# Patient Record
Sex: Male | Born: 1970 | Race: Black or African American | Hispanic: No | Marital: Single | Smoking: Current every day smoker
Health system: Southern US, Community
[De-identification: ages and names within clinical notes are randomized; demographics above are authoritative.]

## PROBLEM LIST (undated history)

## (undated) DIAGNOSIS — C9200 Acute myeloblastic leukemia, not having achieved remission: Principal | ICD-10-CM

## (undated) DIAGNOSIS — I10 Essential (primary) hypertension: Secondary | ICD-10-CM

## (undated) DIAGNOSIS — B1920 Unspecified viral hepatitis C without hepatic coma: Secondary | ICD-10-CM

## (undated) HISTORY — DX: Acute myeloblastic leukemia, not having achieved remission: C92.00

## (undated) HISTORY — PX: PORTACATH PLACEMENT: SHX2246

## (undated) HISTORY — PX: BONE MARROW ASPIRATION: SHX1252

## (undated) HISTORY — DX: Essential (primary) hypertension: I10

---

## 2014-06-16 ENCOUNTER — Telehealth: Payer: Self-pay | Admitting: Hematology

## 2014-06-16 NOTE — Telephone Encounter (Signed)
CALLED PATIENT TO GIVE NP APPT NO ANSWER NOT ABLE TO LEAVE MESSAGE.

## 2014-06-27 ENCOUNTER — Ambulatory Visit: Payer: Self-pay

## 2014-06-27 ENCOUNTER — Ambulatory Visit: Payer: Self-pay | Admitting: Oncology

## 2014-06-27 ENCOUNTER — Other Ambulatory Visit: Payer: Self-pay

## 2014-07-26 ENCOUNTER — Telehealth: Payer: Self-pay | Admitting: Hematology and Oncology

## 2014-07-26 ENCOUNTER — Encounter: Payer: Self-pay | Admitting: Hematology and Oncology

## 2014-07-26 ENCOUNTER — Other Ambulatory Visit: Payer: Self-pay | Admitting: Hematology and Oncology

## 2014-07-26 ENCOUNTER — Other Ambulatory Visit: Payer: Self-pay

## 2014-07-26 DIAGNOSIS — C9200 Acute myeloblastic leukemia, not having achieved remission: Secondary | ICD-10-CM

## 2014-07-26 HISTORY — DX: Acute myeloblastic leukemia, not having achieved remission: C92.00

## 2014-07-26 NOTE — Telephone Encounter (Signed)
cld & spoke w/pt in re to appt-gave pt time & date-sent [facility] staff message to sch pt blood trans-adv pt she will be calling

## 2014-07-26 NOTE — Telephone Encounter (Signed)
per pof to sch pt appt-Urgent pof to sch blood trans-sent [facility] staff message to advise trmt room has no openings-adv she would have to call [facility] to sch

## 2014-07-27 ENCOUNTER — Telehealth: Payer: Self-pay | Admitting: Hematology and Oncology

## 2014-07-27 ENCOUNTER — Ambulatory Visit: Payer: Self-pay

## 2014-07-27 ENCOUNTER — Telehealth: Payer: Self-pay | Admitting: *Deleted

## 2014-07-27 ENCOUNTER — Encounter: Payer: Self-pay | Admitting: Hematology and Oncology

## 2014-07-27 ENCOUNTER — Ambulatory Visit (HOSPITAL_BASED_OUTPATIENT_CLINIC_OR_DEPARTMENT_OTHER): Payer: Self-pay | Admitting: Hematology and Oncology

## 2014-07-27 ENCOUNTER — Other Ambulatory Visit: Payer: Self-pay | Admitting: Hematology and Oncology

## 2014-07-27 ENCOUNTER — Ambulatory Visit (HOSPITAL_BASED_OUTPATIENT_CLINIC_OR_DEPARTMENT_OTHER): Payer: Self-pay

## 2014-07-27 ENCOUNTER — Other Ambulatory Visit (HOSPITAL_BASED_OUTPATIENT_CLINIC_OR_DEPARTMENT_OTHER): Payer: Self-pay

## 2014-07-27 ENCOUNTER — Ambulatory Visit (HOSPITAL_COMMUNITY)
Admission: RE | Admit: 2014-07-27 | Discharge: 2014-07-27 | Disposition: A | Discharge status: Home / Self Care | Payer: Medicaid Other | Source: Ambulatory Visit | Attending: Hematology and Oncology | Admitting: Hematology and Oncology

## 2014-07-27 VITALS — BP 127/68 | HR 73 | Temp 98.2°F | Resp 18

## 2014-07-27 VITALS — BP 130/74 | HR 64 | Temp 97.8°F | Resp 19 | Ht 71.0 in | Wt 174.1 lb

## 2014-07-27 DIAGNOSIS — F17200 Nicotine dependence, unspecified, uncomplicated: Secondary | ICD-10-CM

## 2014-07-27 DIAGNOSIS — D630 Anemia in neoplastic disease: Secondary | ICD-10-CM

## 2014-07-27 DIAGNOSIS — J17 Pneumonia in diseases classified elsewhere: Secondary | ICD-10-CM

## 2014-07-27 DIAGNOSIS — C9201 Acute myeloblastic leukemia, in remission: Secondary | ICD-10-CM

## 2014-07-27 DIAGNOSIS — C9200 Acute myeloblastic leukemia, not having achieved remission: Secondary | ICD-10-CM

## 2014-07-27 DIAGNOSIS — D6959 Other secondary thrombocytopenia: Secondary | ICD-10-CM

## 2014-07-27 DIAGNOSIS — T50905A Adverse effect of unspecified drugs, medicaments and biological substances, initial encounter: Secondary | ICD-10-CM

## 2014-07-27 DIAGNOSIS — R748 Abnormal levels of other serum enzymes: Secondary | ICD-10-CM

## 2014-07-27 DIAGNOSIS — T451X5A Adverse effect of antineoplastic and immunosuppressive drugs, initial encounter: Secondary | ICD-10-CM

## 2014-07-27 DIAGNOSIS — D72819 Decreased white blood cell count, unspecified: Secondary | ICD-10-CM

## 2014-07-27 DIAGNOSIS — D701 Agranulocytosis secondary to cancer chemotherapy: Secondary | ICD-10-CM

## 2014-07-27 DIAGNOSIS — J168 Pneumonia due to other specified infectious organisms: Secondary | ICD-10-CM | POA: Insufficient documentation

## 2014-07-27 DIAGNOSIS — B49 Unspecified mycosis: Secondary | ICD-10-CM

## 2014-07-27 LAB — COMPREHENSIVE METABOLIC PANEL (CC13)
ALK PHOS: 174 U/L — AB (ref 40–150)
ALT: 62 U/L — AB (ref 0–55)
AST: 65 U/L — AB (ref 5–34)
Albumin: 4 g/dL (ref 3.5–5.0)
Anion Gap: 9 mEq/L (ref 3–11)
BILIRUBIN TOTAL: 0.83 mg/dL (ref 0.20–1.20)
BUN: 18.1 mg/dL (ref 7.0–26.0)
CO2: 30 mEq/L — ABNORMAL HIGH (ref 22–29)
CREATININE: 1.2 mg/dL (ref 0.7–1.3)
Calcium: 9.5 mg/dL (ref 8.4–10.4)
Chloride: 100 mEq/L (ref 98–109)
EGFR: 85 mL/min/{1.73_m2} — ABNORMAL LOW (ref >90)
Glucose: 119 mg/dl (ref 70–140)
Potassium: 3.6 mEq/L (ref 3.5–5.1)
Sodium: 139 mEq/L (ref 136–145)
Total Protein: 7.6 g/dL (ref 6.4–8.3)

## 2014-07-27 LAB — CBC WITH DIFFERENTIAL/PLATELET
BASO%: 0 % (ref 0.0–2.0)
Basophils Absolute: 0 10*3/uL (ref 0.0–0.1)
EOS%: 0 % (ref 0.0–7.0)
Eosinophils Absolute: 0 10*3/uL (ref 0.0–0.5)
HEMATOCRIT: 28 % — AB (ref 38.4–49.9)
HGB: 10.1 g/dL — ABNORMAL LOW (ref 13.0–17.1)
LYMPH%: 49.1 % — AB (ref 14.0–49.0)
MCH: 31 pg (ref 27.2–33.4)
MCHC: 36.1 g/dL — AB (ref 32.0–36.0)
MCV: 85.9 fL (ref 79.3–98.0)
MONO#: 0 10*3/uL — ABNORMAL LOW (ref 0.1–0.9)
MONO%: 0 % (ref 0.0–14.0)
NEUT#: 0.6 10*3/uL — ABNORMAL LOW (ref 1.5–6.5)
NEUT%: 50.9 % (ref 39.0–75.0)
PLATELETS: 11 10*3/uL — AB (ref 140–400)
RBC: 3.26 10*6/uL — ABNORMAL LOW (ref 4.20–5.82)
RDW: 12.3 % (ref 11.0–14.6)
WBC: 1.1 10*3/uL — ABNORMAL LOW (ref 4.0–10.3)
lymph#: 0.6 10*3/uL — ABNORMAL LOW (ref 0.9–3.3)

## 2014-07-27 LAB — HOLD TUBE, BLOOD BANK

## 2014-07-27 LAB — TECHNOLOGIST REVIEW

## 2014-07-27 LAB — ABO/RH: ABO/RH(D): A NEG

## 2014-07-27 MED ORDER — HEPARIN SOD (PORK) LOCK FLUSH 100 UNIT/ML IV SOLN
500.0000 [IU] | Freq: Every day | INTRAVENOUS | Status: DC | PRN
  Filled 2014-07-27: qty 5

## 2014-07-27 MED ORDER — SODIUM CHLORIDE 0.9 % IJ SOLN
10.0000 mL | INTRAMUSCULAR | Status: DC | PRN
  Filled 2014-07-27: qty 10

## 2014-07-27 MED ORDER — LIDOCAINE-PRILOCAINE 2.5-2.5 % EX CREA: 1.0000 "application " | TOPICAL_CREAM | CUTANEOUS | Status: DC | PRN

## 2014-07-27 MED ORDER — SODIUM CHLORIDE 0.9 % IV SOLN
250.0000 mL | Freq: Once | INTRAVENOUS | Status: AC
  Administered 2014-07-27: 250 mL via INTRAVENOUS

## 2014-07-27 NOTE — Assessment & Plan Note (Signed)
This is likely due to recent treatment. The patient denies recent history of bleeding such as epistaxis, hematuria or hematochezia. He is asymptomatic from the low platelet count. With the blood counts running so low, I recommend transfusions of platelets today. The patient will need irradiated blood products. I recommend transfusion threshold of 15,000 for him

## 2014-07-27 NOTE — Patient Instructions (Signed)
Platelet Transfusion Information °This is information about transfusions of platelets. Platelets are tiny cells made by the bone marrow and found in the blood. When a blood vessel is damaged, platelets rush to the damaged area to help form a clot. This begins the healing process. When platelets get very low, your blood may have trouble clotting. This may be from: °· Illness. °· Blood disorder. °· Chemotherapy to treat cancer. °Often, lower platelet counts do not cause problems.  °Platelets usually last for 7 to 10 days. If they are not used in an injury, they are broken down by the liver or spleen. °Symptoms of low platelet count include: °· Nosebleeds. °· Bleeding gums. °· Heavy periods. °· Bruising and tiny blood spots in the skin. °¨ Pinpoint spots of bleeding (petechiae). °¨ Larger bruises (purpura). °· Bleeding can be more serious if it happens in the brain or bowel. °Platelet transfusions are often used to keep the platelet count at an acceptable level. Serious bleeding due to low platelets is uncommon. °RISKS AND COMPLICATIONS °Severe side effects from platelet transfusions are uncommon. Minor reactions may include: °· Itching. °· Rashes. °· High temperature and shivering. °Medications are available to stop transfusion reactions. Let your health care provider know if you develop any of the above problems.  °If you are having platelet transfusions frequently, they may get less effective. This is called becoming refractory to platelets. It is uncommon. This can happen from non-immune causes and immune causes. Non-immune causes include: °· High temperatures. °· Some medications. °· An enlarged spleen. °Immune causes happen when your body discovers the platelets are not your own and begins making antibodies against them. The antibodies kill the platelets quickly. Even with platelet transfusions, you may still notice problems with bleeding or bruising. Let your health care providers know about this. Other things  can be done to help if this happens.  °BEFORE THE PROCEDURE  °· Your health care provider will check your platelet count regularly. °· If the platelet count is too low, it may be necessary to have a platelet transfusion. °· This is more important before certain procedures with a risk of bleeding, such as a spinal tap. °· Platelet transfusion reduces the risk of bleeding during or after the procedure. °· Except in emergencies, giving a transfusion requires a written consent. °Before blood is taken from a donor, a complete history is taken to make sure the person has no history of previous diseases, nor engages in risky social behavior. Examples of this are intravenous drug use or sexual activity with multiple partners. This could lead to infected blood or blood products being used. This history is taken in spite of the extensive testing to make sure the blood is safe. All blood products transfused are tested to make sure it is a match for the person getting the blood. It is also checked for infections. Blood is the safest it has ever been. The risk of getting an infection is very low. °PROCEDURE °· The platelets are stored in small plastic bags that are kept at a low temperature. °· Each bag is called a unit and sometimes two units are given. They are given through an intravenous line by drip infusion over about one-half hour. °· Usually blood is collected from multiple people to get enough to transfuse. °· Sometimes, the platelets are collected from a single person. This is done using a special machine that separates the platelets from the blood. The machine is called an apheresis machine. Platelets collected in this   way are called apheresed platelets. Apheresed platelets reduce the risk of becoming sensitive to the platelets. This lowers the chances of having a transfusion reaction. °· As it only takes a short time to give the platelets, this treatment can be given in an outpatient department. Platelets can also be  given before or after other treatments. °SEEK IMMEDIATE MEDICAL CARE IF: °You have any of the following symptoms over the next 12 hours or several days: °· Shaking chills. °· Fever with a temperature greater than 102°F (38.9°C) develops. °· Back pain or muscle pain. °· People around you feel you are not acting correctly, or you are confused. °· Blood in the urine or bowel movements, or bleeding from any place in your body. °· Shortness of breath, or difficulty breathing. °· Dizziness. °· Fainting. °· You break out in a rash or develop hives. °· Decrease in the amount of urine you are putting out, or the urine turns a dark color or changes to pink, red, or brown. °· A severe headache or stiff neck. °· Bruising more easily. °Document Released: 06/08/2007 Document Revised: 12/26/2013 Document Reviewed: 06/08/2007 °Exit[employer]® Patient Information ©2015 [employer]. This information is not intended to replace advice given to you by your health care provider. Make sure you discuss any questions you have with your health care provider. ° °

## 2014-07-27 NOTE — Progress Notes (Signed)
[facility] CONSULT NOTE  Patient Care Team: Art Buff, MD as PCP - General (Hematology and Oncology)  CHIEF COMPLAINTS/PURPOSE OF CONSULTATION:  Acute myelogenous leukemia, for supportive care  HISTORY OF PRESENTING ILLNESS:  Derrick Callahan 43 y.o. male is here because of recent diagnosis of acute myelogenous subcutaneous. I will review his outside records extensively and summarized as follows: He presented to the emergency department with gingival bleeding and fatigue. When he presented to the emergency department, he has significant elevated white blood cell count of greater than 10 100,000 with significant blasts counts, severe anemia and low platelet count. 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 The patient has achieved good remission status. He received consolidation chemotherapy with high-dose cytarabine on November 19. He is referred here for transfusion support due to difficulties in traveling to [facility] [place] for supportive care. He is doing well. Denies recent infection. The patient denies any recent signs or symptoms of bleeding such as spontaneous epistaxis, hematuria or hematochezia.   MEDICAL HISTORY:  Past Medical History  Diagnosis Date  . AML (acute myelogenous leukemia) 07/26/2014  . Hypertension     SURGICAL HISTORY: Past Surgical History  Procedure Laterality Date  . Portacath placement      SOCIAL HISTORY: History   Social History  . Marital Status: Single    Spouse Name: N/A    Number of Children: N/A  . Years of Education: N/A   Occupational History  . Not on file.   Social History Main Topics  . Smoking  status: Current Every Day Smoker -- 0.20 packs/day for 24 years  . Smokeless tobacco: Never Used  . Alcohol Use: 0.6 oz/week    1 Shots of liquor per week  . Drug Use: No  . Sexual Activity: Not on file   Other Topics Concern  . Not on file   Social History Narrative    FAMILY HISTORY: Family History  Problem Relation Age of Onset  . Cancer Maternal Grandmother     unknown ca    ALLERGIES:  has No Known Allergies.  MEDICATIONS:  Current Outpatient Prescriptions  Medication Sig Dispense Refill  . acyclovir (ZOVIRAX) 400 MG tablet Take 400 mg by mouth 2 (two) times daily.    . hydrochlorothiazide (HYDRODIURIL) 25 MG tablet Take 25 mg by mouth daily.  5  . levofloxacin (LEVAQUIN) 500 MG tablet Take 500 mg by mouth daily.    . prochlorperazine (COMPAZINE) 10 MG tablet Take 10 mg by mouth every 6 (six) hours as needed for nausea or vomiting.    . voriconazole (VFEND) 200 MG tablet Take 200 mg by mouth 2 (two) times daily.    Marland Kitchen lidocaine-prilocaine (EMLA) cream Apply 1 application topically as needed. Apply to porta cath site one hour prior to needle stick. 30 g 6   No current facility-administered medications for this visit.    REVIEW OF SYSTEMS:   Constitutional: Denies fevers, chills or abnormal night sweats Eyes: Denies blurriness of vision, double vision or watery eyes Ears, nose, mouth, throat, and face: Denies mucositis or sore throat Respiratory: Denies cough, dyspnea or wheezes Cardiovascular: Denies palpitation, chest discomfort or lower extremity swelling Gastrointestinal:  Denies nausea, heartburn or change in bowel habits Skin: Denies abnormal skin  rashes Lymphatics: Denies new lymphadenopathy or easy bruising Neurological:Denies numbness, tingling or new weaknesses Behavioral/Psych: Mood is stable, no new changes  All other systems were reviewed with the patient and are negative.  PHYSICAL EXAMINATION: ECOG PERFORMANCE STATUS: 0 - Asymptomatic  Filed Vitals:    07/27/14 1130  BP: 130/74  Pulse: 64  Temp: 97.8 F (36.6 C)  Resp: 19   Filed Weights   07/27/14 1130  Weight: 174 lb 1.6 oz (78.971 kg)    GENERAL:alert, no distress and comfortable SKIN: skin color, texture, turgor are normal, no rashes or significant lesions EYES: normal, conjunctiva are pale and non-injected, sclera clear OROPHARYNX:no exudate, no erythema and lips, buccal mucosa, and tongue normal  NECK: supple, thyroid normal size, non-tender, without nodularity LYMPH:  no palpable lymphadenopathy in the cervical, axillary or inguinal LUNGS: clear to auscultation and percussion with normal breathing effort HEART: regular rate & rhythm and no murmurs and no lower extremity edema ABDOMEN:abdomen soft, non-tender and normal bowel sounds Musculoskeletal:no cyanosis of digits and no clubbing  PSYCH: alert & oriented x 3 with fluent speech NEURO: no focal motor/sensory deficits  LABORATORY DATA:  I have reviewed the data as listed Lab Results  Component Value Date   WBC 1.1* 07/27/2014   HGB 10.1* 07/27/2014   HCT 28.0* 07/27/2014   MCV 85.9 07/27/2014   PLT 11* 07/27/2014    Recent Labs  07/27/14 1101  NA 139  K 3.6  CO2 30*  GLUCOSE 119  BUN 18.1  CREATININE 1.2  CALCIUM 9.5  PROT 7.6  ALBUMIN 4.0  AST 65*  ALT 62*  ALKPHOS 174*  BILITOT 0.83  ASSESSMENT & PLAN:  AML (acute myelogenous leukemia) Overall, he tolerated treatment well with expected side effects. I will continue supportive care visits here. The patient will come in on Mondays and Thursdays every week for transfusion support. I will see him in January after his second consolidation chemotherapy.  Thrombocytopenia due to drugs This is likely due to recent treatment. The patient denies recent history of bleeding such as epistaxis, hematuria or hematochezia. He is asymptomatic from the low platelet count. With the blood counts running so low, I recommend transfusions of platelets today. The  patient will need irradiated blood products. I recommend transfusion threshold of 15,000 for him   Leukopenia due to antineoplastic chemotherapy This is likely due to recent treatment. The patient denies recent history of fevers, cough, chills, diarrhea or dysuria. He is asymptomatic from the leukopenia. I will observe for now.  He will continue antimicrobial therapy as directed by his oncologist at [place].  Fungal pneumonia He is currently receiving active treatment for this.  Anemia in neoplastic disease This is likely due to recent treatment. The patient denies recent history of bleeding such as epistaxis, hematuria or hematochezia. He is asymptomatic from the anemia. I will observe for now.   We'll establish transfusion threshold to give him 1 unit of blood whenever his hemoglobin is less than 8 g. He needs irradiated blood products.  Elevated liver enzymes This is likely related to recent treatment and his antimicrobial agents. I will monitor this closely. I recommend the patient to stop drinking alcohol.  Tobacco dependency I spent some time counseling the patient the importance of tobacco cessation. he is currently attempting to quit on his own      All questions were answered. The patient knows to call the clinic with any problems, questions or concerns. I spent 55 minutes counseling the patient face  to face. The total time spent in the appointment was 60 minutes and more than 50% was on counseling.     [facility], Blaise Grieshaber, MD 07/27/2014 9:34 PM

## 2014-07-27 NOTE — Telephone Encounter (Signed)
-----   Message from [place] sent at 07/27/2014  8:29 AM EST ----- Patient is calling want to know what time blood trans would be & where. Please contact pt as patient coming in for an appointment here today.  ----- Message -----    From: Melina Modena McCoy-Collins    Sent: 07/26/2014   2:33 PM      To: Cathlean Cower, RN, #  Per trmtroom no availability. You will need to contact [facility] to see if the patient can go there. Appt is sch per Urgent pof.  Thanks,  [facility]

## 2014-07-27 NOTE — Assessment & Plan Note (Signed)
I spent some time counseling the patient the importance of tobacco cessation. he is currently attempting to quit on his own 

## 2014-07-27 NOTE — Assessment & Plan Note (Signed)
This is likely due to recent treatment. The patient denies recent history of bleeding such as epistaxis, hematuria or hematochezia. He is asymptomatic from the anemia. I will observe for now.   We'll establish transfusion threshold to give him 1 unit of blood whenever his hemoglobin is less than 8 g. He needs irradiated blood products. 

## 2014-07-27 NOTE — Telephone Encounter (Signed)
Per staff message and POF I have scheduled appts. Advised scheduler of appts. JMW  

## 2014-07-27 NOTE — Assessment & Plan Note (Signed)
Overall, he tolerated treatment well with expected side effects. I will continue supportive care visits here. The patient will come in on Mondays and Thursdays every week for transfusion support. I will see him in January after his second consolidation chemotherapy.

## 2014-07-27 NOTE — Assessment & Plan Note (Signed)
This is likely due to recent treatment. The patient denies recent history of fevers, cough, chills, diarrhea or dysuria. He is asymptomatic from the leukopenia. I will observe for now.  He will continue antimicrobial therapy as directed by his oncologist at [place].

## 2014-07-27 NOTE — Assessment & Plan Note (Signed)
He is currently receiving active treatment for this.

## 2014-07-27 NOTE — Telephone Encounter (Signed)
Confirmed pt's appts today.  He verbalized understanding.

## 2014-07-27 NOTE — Telephone Encounter (Signed)
gv and printed appt sched and avs for Dec....sed added tx

## 2014-07-27 NOTE — Assessment & Plan Note (Signed)
This is likely related to recent treatment and his antimicrobial agents. I will monitor this closely. I recommend the patient to stop drinking alcohol.

## 2014-07-27 NOTE — Progress Notes (Signed)
Per Dr.Gorsuch, use POC for platelet transfusion. POC is double, inner port accessed.

## 2014-07-27 NOTE — Progress Notes (Signed)
Checked in new pt with no insurance.  Pt has applied for Medicaid is awaiting approval.  I advised pt to let us know when he gets approved so billing can resubmit bills to [facility] for reimbursement.

## 2014-07-27 NOTE — Telephone Encounter (Signed)
pt cld wanting toknow about his appt and blood trnas and directions-gave appt & directions-adv Dr Alvy Bimler nurse s/b calling him with that time & location-resent srtaff message to [facility] & cc Dr Alvy Bimler & Melissa-message sent urgent 07/27/14-Adv pt I would have nurse callback-tried calling n/a. Cld pt back he stated he was on the phone talking to nurse

## 2014-07-28 LAB — PREPARE PLATELET PHERESIS: UNIT DIVISION: 0

## 2014-07-31 ENCOUNTER — Ambulatory Visit (HOSPITAL_BASED_OUTPATIENT_CLINIC_OR_DEPARTMENT_OTHER): Payer: Self-pay

## 2014-07-31 ENCOUNTER — Other Ambulatory Visit: Payer: Self-pay | Admitting: *Deleted

## 2014-07-31 ENCOUNTER — Telehealth: Payer: Self-pay | Admitting: *Deleted

## 2014-07-31 ENCOUNTER — Other Ambulatory Visit (HOSPITAL_BASED_OUTPATIENT_CLINIC_OR_DEPARTMENT_OTHER): Payer: Self-pay

## 2014-07-31 VITALS — BP 149/83 | HR 58 | Temp 98.6°F | Resp 18

## 2014-07-31 DIAGNOSIS — C9200 Acute myeloblastic leukemia, not having achieved remission: Secondary | ICD-10-CM

## 2014-07-31 LAB — COMPREHENSIVE METABOLIC PANEL (CC13)
ALT: 54 U/L (ref 0–55)
ANION GAP: 9 meq/L (ref 3–11)
AST: 57 U/L — ABNORMAL HIGH (ref 5–34)
Albumin: 3.6 g/dL (ref 3.5–5.0)
Alkaline Phosphatase: 157 U/L — ABNORMAL HIGH (ref 40–150)
BUN: 18.2 mg/dL (ref 7.0–26.0)
CO2: 28 meq/L (ref 22–29)
CREATININE: 1.1 mg/dL (ref 0.7–1.3)
Calcium: 9.1 mg/dL (ref 8.4–10.4)
Chloride: 105 mEq/L (ref 98–109)
EGFR: >90 mL/min/{1.73_m2} (ref >90)
Glucose: 93 mg/dl (ref 70–140)
Potassium: 4.2 mEq/L (ref 3.5–5.1)
Sodium: 142 mEq/L (ref 136–145)
Total Bilirubin: 0.67 mg/dL (ref 0.20–1.20)
Total Protein: 6.9 g/dL (ref 6.4–8.3)

## 2014-07-31 LAB — HOLD TUBE, BLOOD BANK

## 2014-07-31 LAB — CBC WITH DIFFERENTIAL/PLATELET
BASO%: 2.1 % — ABNORMAL HIGH (ref 0.0–2.0)
Basophils Absolute: 0 10*3/uL (ref 0.0–0.1)
EOS ABS: 0 10*3/uL (ref 0.0–0.5)
EOS%: 0 % (ref 0.0–7.0)
HCT: 21.9 % — ABNORMAL LOW (ref 38.4–49.9)
HGB: 7.9 g/dL — ABNORMAL LOW (ref 13.0–17.1)
LYMPH%: 87.5 % — ABNORMAL HIGH (ref 14.0–49.0)
MCH: 30.3 pg (ref 27.2–33.4)
MCHC: 36.1 g/dL — ABNORMAL HIGH (ref 32.0–36.0)
MCV: 83.9 fL (ref 79.3–98.0)
MONO#: 0 10*3/uL — ABNORMAL LOW (ref 0.1–0.9)
MONO%: 4.2 % (ref 0.0–14.0)
NEUT%: 6.2 % — ABNORMAL LOW (ref 39.0–75.0)
NEUTROS ABS: 0 10*3/uL — AB (ref 1.5–6.5)
NRBC: 0 % (ref 0–0)
PLATELETS: 9 10*3/uL — AB (ref 140–400)
RBC: 2.61 10*6/uL — AB (ref 4.20–5.82)
RDW: 12.1 % (ref 11.0–14.6)
WBC: 0.5 10*3/uL — CL (ref 4.0–10.3)
lymph#: 0.4 10*3/uL — ABNORMAL LOW (ref 0.9–3.3)

## 2014-07-31 LAB — PREPARE RBC (CROSSMATCH)

## 2014-07-31 MED ORDER — HEPARIN SOD (PORK) LOCK FLUSH 100 UNIT/ML IV SOLN
250.0000 [IU] | INTRAVENOUS | Status: DC | PRN
  Filled 2014-07-31: qty 5

## 2014-07-31 MED ORDER — SODIUM CHLORIDE 0.9 % IV SOLN
Freq: Once | INTRAVENOUS | Status: AC
  Administered 2014-07-31: 11:00:00 via INTRAVENOUS

## 2014-07-31 MED ORDER — SODIUM CHLORIDE 0.9 % IJ SOLN
10.0000 mL | INTRAMUSCULAR | Status: AC | PRN
  Administered 2014-07-31: 10 mL
  Filled 2014-07-31: qty 10

## 2014-07-31 MED ORDER — HEPARIN SOD (PORK) LOCK FLUSH 100 UNIT/ML IV SOLN
500.0000 [IU] | Freq: Every day | INTRAVENOUS | Status: AC | PRN
  Administered 2014-07-31: 500 [IU]
  Filled 2014-07-31: qty 5

## 2014-07-31 NOTE — Patient Instructions (Signed)
Blood Transfusion Information WHAT IS A BLOOD TRANSFUSION? A transfusion is the replacement of blood or some of its parts. Blood is made up of multiple cells which provide different functions.  Red blood cells carry oxygen and are used for blood loss replacement.  White blood cells fight against infection.  Platelets control bleeding.  Plasma helps clot blood.  Other blood products are available for specialized needs, such as hemophilia or other clotting disorders. BEFORE THE TRANSFUSION  Who gives blood for transfusions?   You may be able to donate blood to be used at a later date on yourself (autologous donation).  Relatives can be asked to donate blood. This is generally not any safer than if you have received blood from a stranger. The same precautions are taken to ensure safety when a relative's blood is donated.  Healthy volunteers who are fully evaluated to make sure their blood is safe. This is blood bank blood. Transfusion therapy is the safest it has ever been in the practice of medicine. Before blood is taken from a donor, a complete history is taken to make sure that person has no history of diseases nor engages in risky social behavior (examples are intravenous drug use or sexual activity with multiple partners). The donor's travel history is screened to minimize risk of transmitting infections, such as malaria. The donated blood is tested for signs of infectious diseases, such as HIV and hepatitis. The blood is then tested to be sure it is compatible with you in order to minimize the chance of a transfusion reaction. If you or a relative donates blood, this is often done in anticipation of surgery and is not appropriate for emergency situations. It takes many days to process the donated blood. RISKS AND COMPLICATIONS Although transfusion therapy is very safe and saves many lives, the main dangers of transfusion include:   Getting an infectious disease.  Developing a  transfusion reaction. This is an allergic reaction to something in the blood you were given. Every precaution is taken to prevent this. The decision to have a blood transfusion has been considered carefully by your caregiver before blood is given. Blood is not given unless the benefits outweigh the risks. AFTER THE TRANSFUSION  Right after receiving a blood transfusion, you will usually feel much better and more energetic. This is especially true if your red blood cells have gotten low (anemic). The transfusion raises the level of the red blood cells which carry oxygen, and this usually causes an energy increase.  The nurse administering the transfusion will monitor you carefully for complications. HOME CARE INSTRUCTIONS  No special instructions are needed after a transfusion. You may find your energy is better. Speak with your caregiver about any limitations on activity for underlying diseases you may have. SEEK MEDICAL CARE IF:   Your condition is not improving after your transfusion.  You develop redness or irritation at the intravenous (IV) site. SEEK IMMEDIATE MEDICAL CARE IF:  Any of the following symptoms occur over the next 12 hours:  Shaking chills.  You have a temperature by mouth above 102 F (38.9 C), not controlled by medicine.  Chest, back, or muscle pain.  People around you feel you are not acting correctly or are confused.  Shortness of breath or difficulty breathing.  Dizziness and fainting.  You get a rash or develop hives.  You have a decrease in urine output.  Your urine turns a dark color or changes to pink, red, or brown. Any of the following   symptoms occur over the next 10 days:  You have a temperature by mouth above 102 F (38.9 C), not controlled by medicine.  Shortness of breath.  Weakness after normal activity.  The white part of the eye turns yellow (jaundice).  You have a decrease in the amount of urine or are urinating less often.  Your  urine turns a dark color or changes to pink, red, or brown. Document Released: 08/08/2000 Document Revised: 11/03/2011 Document Reviewed: 03/27/2008 [facility] Patient Information 2015 [place], [place]. This information is not intended to replace advice given to you by your health care provider. Make sure you discuss any questions you have with your health care provider.   Neutropenia Neutropenia is a condition that occurs when the level of a certain type of white blood cell (neutrophil) in your body becomes lower than normal. Neutrophils are made in the bone marrow and fight infections. These cells protect against bacteria and viruses. The fewer neutrophils you have, and the longer your body remains without them, the greater your risk of getting a severe infection becomes. CAUSES  The cause of neutropenia may be hard to determine. However, it is usually due to 3 main problems:   Decreased production of neutrophils. This may be due to:  Certain medicines such as chemotherapy.  Genetic problems.  Cancer.  Radiation treatments.  Vitamin deficiency.  Some pesticides.  Increased destruction of neutrophils. This may be due to:  Overwhelming infections.  Hemolytic anemia. This is when the body destroys its own blood cells.  Chemotherapy.  Neutrophils moving to areas of the body where they cannot fight infections. This may be due to:  Dialysis procedures.  Conditions where the spleen becomes enlarged. Neutrophils are held in the spleen and are not available to the rest of the body.  Overwhelming infections. The neutrophils are held in the area of the infection and are not available to the rest of the body. SYMPTOMS  There are no specific symptoms of neutropenia. The lack of neutrophils can result in an infection, and an infection can cause various problems. DIAGNOSIS  Diagnosis is made by a blood test. A complete blood count is performed. The normal level of neutrophils in human blood  differs with age and race. Infants have lower counts than older children and adults. African Americans have lower counts than Caucasians or Asians. The average adult level is 1500 cells/mm3 of blood. Neutrophil counts are interpreted as follows:  Greater than 1000 cells/mm3 gives normal protection against infection.  500 to 1000 cells/mm3 gives an increased risk for infection.  200 to 500 cells/mm3 is a greater risk for severe infection.  Lower than 200 cells/mm3 is a marked risk of infection. This may require hospitalization and treatment with antibiotic medicines. TREATMENT  Treatment depends on the underlying cause, severity, and presence of infections or symptoms. It also depends on your health. Your caregiver will discuss the treatment plan with you. Mild cases are often easily treated and have a good outcome. Preventative measures may also be started to limit your risk of infections. Treatment can include:  Taking antibiotics.  Stopping medicines that are known to cause neutropenia.  Correcting nutritional deficiencies by eating green vegetables to supply folic acid and taking vitamin B supplements.  Stopping exposure to pesticides if your neutropenia is related to pesticide exposure.  Taking a blood growth factor called sargramostim, pegfilgrastim, or filgrastim if you are undergoing chemotherapy for cancer. This stimulates white blood cell production.  Removal of the spleen if you have  Felty's syndrome and have repeated infections. HOME CARE INSTRUCTIONS   Follow your caregiver's instructions about when you need to have blood work done.  Wash your hands often. Make sure others who come in contact with you also wash their hands.  Wash raw fruits and vegetables before eating them. They can carry bacteria and fungi.  Avoid people with colds or spreadable (contagious) diseases (chickenpox, herpes zoster, influenza).  Avoid large crowds.  Avoid construction areas. The dust can  release fungus into the air.  Be cautious around children in daycare or school environments.  Take care of your respiratory system by coughing and deep breathing.  Bathe daily.  Protect your skin from cuts and burns.  Do not work in the garden or with flowers and plants.  Care for the mouth before and after meals by brushing with a soft toothbrush. If you have mucositis, do not use mouthwash. Mouthwash contains alcohol and can dry out the mouth even more.  Clean the area between the genitals and the anus (perineal area) after urination and bowel movements. Women need to wipe from front to back.  Use a water soluble lubricant during sexual intercourse and practice good hygiene after. Do not have intercourse if you are severely neutropenic. Check with your caregiver for guidelines.  Exercise daily as tolerated.  Avoid people who were vaccinated with a live vaccine in the past 30 days. You should not receive live vaccines (polio, typhoid).  Do not provide direct care for pets. Avoid animal droppings. Do not clean litter boxes and bird cages.  Do not share food utensils.  Do not use tampons, enemas, or rectal suppositories unless directed by your caregiver.  Use an electric razor to remove hair.  Wash your hands after handling magazines, letters, and newspapers. SEEK IMMEDIATE MEDICAL CARE IF:   You have a fever.  You have chills or start to shake.  You feel nauseous or vomit.  You develop mouth sores.  You develop aches and pains.  You have redness and swelling around open wounds.  Your skin is warm to the touch.  You have pus coming from your wounds.  You develop swollen lymph nodes.  You feel weak or fatigued.  You develop red streaks on the skin. MAKE SURE YOU:  Understand these instructions.  Will watch your condition.  Will get help right away if you are not doing well or get worse. Document Released: 01/31/2002 Document Revised: 11/03/2011 Document  Reviewed: 02/28/2011 [facility] Patient Information 2015 [place], [place]. This information is not intended to replace advice given to you by your health care provider. Make sure you discuss any questions you have with your health care provider.    Thrombocytopenia Thrombocytopenia is a condition in which there is an abnormally small number of platelets in your blood. Platelets are also called thrombocytes. Platelets are needed for blood clotting. CAUSES Thrombocytopenia is caused by:   Decreased production of platelets. This can be caused by:  Aplastic anemia in which your bone marrow quits making blood cells.  Cancer in the bone marrow.  Use of certain medicines, including chemotherapy.  Infection in the bone marrow.  Heavy alcohol consumption.  Increased destruction of platelets. This can be caused by:  Certain immune diseases.  Use of certain drugs.  Certain blood clotting disorders.  Certain inherited disorders.  Certain bleeding disorders.  Pregnancy.  Having an enlarged spleen (hypersplenism). In hypersplenism, the spleen gathers up platelets from circulation. This means the platelets are not available to help with  blood clotting. The spleen can enlarge due to cirrhosis or other conditions. SYMPTOMS  The symptoms of thrombocytopenia are side effects of poor blood clotting. Some of these are:  Abnormal bleeding.  Nosebleeds.  Heavy menstrual periods.  Blood in the urine or stools.  Purpura. This is a purplish discoloration in the skin produced by small bleeding vessels near the surface of the skin.  Bruising.  A rash that may be petechial. This looks like pinpoint, purplish-red spots on the skin and mucous membranes. It is caused by bleeding from small blood vessels (capillaries). DIAGNOSIS  Your caregiver will make this diagnosis based on your exam and blood tests. Sometimes, a bone marrow study is done to look for the original cells (megakaryocytes) that  make platelets. TREATMENT  Treatment depends on the cause of the condition.  Medicines may be given to help protect your platelets from being destroyed.  In some cases, a replacement (transfusion) of platelets may be required to stop or prevent bleeding.  Sometimes, the spleen must be surgically removed. HOME CARE INSTRUCTIONS   Check the skin and linings inside your mouth for bruising or bleeding as directed by your caregiver.  Check your sputum, urine, and stool for blood as directed by your caregiver.  Do not return to any activities that could cause bumps or bruises until your caregiver says it is okay.  Take extra care not to cut yourself when shaving or when using scissors, needles, knives, and other tools.  Take extra care not to burn yourself when ironing or cooking.  Ask your caregiver if it is okay for you to drink alcohol.  Only take over-the-counter or prescription medicines as directed by your caregiver.  Notify all your caregivers, including dentists and eye doctors, about your condition. SEEK IMMEDIATE MEDICAL CARE IF:   You develop active bleeding from anywhere in your body.  You develop unexplained bruising or bleeding.  You have blood in your sputum, urine, or stool. MAKE SURE YOU:  Understand these instructions.  Will watch your condition.  Will get help right away if you are not doing well or get worse. Document Released: 08/11/2005 Document Revised: 11/03/2011 Document Reviewed: 06/13/2011 [facility] Patient Information 2015 [place], [place]. This information is not intended to replace advice given to you by your health care provider. Make sure you discuss any questions you have with your health care provider.

## 2014-07-31 NOTE — Telephone Encounter (Signed)
Called pt to inform him to come back for Transfusion.  His mother said he was waiting at [facility].  Found pt in lobby and gave him pager and instructed his transfusion moved up earlier today for Platelets and one unit Blood per Dr. Alvy Bimler.  He verbalized understanding.

## 2014-07-31 NOTE — Telephone Encounter (Signed)
Spoke with male at pt's home, left message for him to return to office. She is texting him to come back.

## 2014-08-01 ENCOUNTER — Telehealth: Payer: Self-pay | Admitting: *Deleted

## 2014-08-01 ENCOUNTER — Telehealth: Payer: Self-pay | Admitting: Hematology and Oncology

## 2014-08-01 LAB — PREPARE PLATELET PHERESIS: Unit division: 0

## 2014-08-01 LAB — TYPE AND SCREEN
ABO/RH(D): A NEG
Antibody Screen: NEGATIVE
UNIT DIVISION: 0

## 2014-08-01 NOTE — Telephone Encounter (Signed)
Received call asking if anything was done for patient's low hemoglobin and platelets. Made RN aware with Dr. Jasper Loser that patient received 1 unit of platelets and 1 unit of blood on 07/31/14. Margarita Grizzle RN to make MD aware.

## 2014-08-01 NOTE — Telephone Encounter (Signed)
Faxed pt labs to Dr. Linus Orn [phone]

## 2014-08-03 ENCOUNTER — Other Ambulatory Visit: Payer: Self-pay | Admitting: Hematology and Oncology

## 2014-08-03 ENCOUNTER — Telehealth: Payer: Self-pay | Admitting: *Deleted

## 2014-08-03 ENCOUNTER — Other Ambulatory Visit (HOSPITAL_BASED_OUTPATIENT_CLINIC_OR_DEPARTMENT_OTHER): Payer: Self-pay

## 2014-08-03 DIAGNOSIS — C9200 Acute myeloblastic leukemia, not having achieved remission: Secondary | ICD-10-CM

## 2014-08-03 LAB — CBC WITH DIFFERENTIAL/PLATELET
BASO%: 0 % (ref 0.0–2.0)
Basophils Absolute: 0 10*3/uL (ref 0.0–0.1)
EOS ABS: 0 10*3/uL (ref 0.0–0.5)
EOS%: 0 % (ref 0.0–7.0)
HCT: 22.5 % — ABNORMAL LOW (ref 38.4–49.9)
HGB: 8.3 g/dL — ABNORMAL LOW (ref 13.0–17.1)
LYMPH%: 86.1 % — AB (ref 14.0–49.0)
MCH: 30.5 pg (ref 27.2–33.4)
MCHC: 36.8 g/dL — ABNORMAL HIGH (ref 32.0–36.0)
MCV: 82.7 fL (ref 79.3–98.0)
MONO#: 0.1 10*3/uL (ref 0.1–0.9)
MONO%: 13.9 % (ref 0.0–14.0)
NEUT#: 0 10*3/uL — CL (ref 1.5–6.5)
NEUT%: 0 % — ABNORMAL LOW (ref 39.0–75.0)
PLATELETS: 20 10*3/uL — AB (ref 140–400)
RBC: 2.72 10*6/uL — ABNORMAL LOW (ref 4.20–5.82)
RDW: 12.3 % (ref 11.0–14.6)
WBC: 0.4 10*3/uL — CL (ref 4.0–10.3)
lymph#: 0.3 10*3/uL — ABNORMAL LOW (ref 0.9–3.3)

## 2014-08-03 LAB — COMPREHENSIVE METABOLIC PANEL (CC13)
ALK PHOS: 164 U/L — AB (ref 40–150)
ALT: 42 U/L (ref 0–55)
AST: 43 U/L — ABNORMAL HIGH (ref 5–34)
Albumin: 3.6 g/dL (ref 3.5–5.0)
Anion Gap: 10 mEq/L (ref 3–11)
BILIRUBIN TOTAL: 0.62 mg/dL (ref 0.20–1.20)
BUN: 16.8 mg/dL (ref 7.0–26.0)
CO2: 26 mEq/L (ref 22–29)
Calcium: 9.3 mg/dL (ref 8.4–10.4)
Chloride: 105 mEq/L (ref 98–109)
Creatinine: 1.1 mg/dL (ref 0.7–1.3)
GLUCOSE: 130 mg/dL (ref 70–140)
Potassium: 3.8 mEq/L (ref 3.5–5.1)
Sodium: 141 mEq/L (ref 136–145)
Total Protein: 7 g/dL (ref 6.4–8.3)

## 2014-08-03 LAB — HOLD TUBE, BLOOD BANK

## 2014-08-03 LAB — TECHNOLOGIST REVIEW

## 2014-08-03 NOTE — Telephone Encounter (Signed)
S/w pt in lobby and gave copy of labs.  Informed him no need for transfusion today per Dr. Alvy Bimler.  Continue neutropenic precautions.  Call for any fever or signs of infection.  Return Monday as scheduled for labs.  Pt verbalized understanding.

## 2014-08-07 ENCOUNTER — Other Ambulatory Visit: Payer: Self-pay | Admitting: Hematology and Oncology

## 2014-08-07 ENCOUNTER — Other Ambulatory Visit (HOSPITAL_BASED_OUTPATIENT_CLINIC_OR_DEPARTMENT_OTHER): Payer: Self-pay

## 2014-08-07 ENCOUNTER — Ambulatory Visit (HOSPITAL_BASED_OUTPATIENT_CLINIC_OR_DEPARTMENT_OTHER): Payer: Self-pay

## 2014-08-07 VITALS — BP 148/82 | HR 67 | Temp 97.7°F | Resp 20

## 2014-08-07 DIAGNOSIS — D630 Anemia in neoplastic disease: Secondary | ICD-10-CM

## 2014-08-07 DIAGNOSIS — C9200 Acute myeloblastic leukemia, not having achieved remission: Secondary | ICD-10-CM | POA: Diagnosis not present

## 2014-08-07 LAB — COMPREHENSIVE METABOLIC PANEL (CC13)
ALBUMIN: 3.6 g/dL (ref 3.5–5.0)
ALT: 33 U/L (ref 0–55)
ANION GAP: 9 meq/L (ref 3–11)
AST: 39 U/L — ABNORMAL HIGH (ref 5–34)
Alkaline Phosphatase: 131 U/L (ref 40–150)
BUN: 12.8 mg/dL (ref 7.0–26.0)
CO2: 28 meq/L (ref 22–29)
Calcium: 9 mg/dL (ref 8.4–10.4)
Chloride: 105 mEq/L (ref 98–109)
Creatinine: 1 mg/dL (ref 0.7–1.3)
EGFR: >90 mL/min/{1.73_m2} (ref >90)
Glucose: 107 mg/dl (ref 70–140)
POTASSIUM: 3.6 meq/L (ref 3.5–5.1)
Sodium: 141 mEq/L (ref 136–145)
TOTAL PROTEIN: 6.9 g/dL (ref 6.4–8.3)
Total Bilirubin: 0.63 mg/dL (ref 0.20–1.20)

## 2014-08-07 LAB — CBC WITH DIFFERENTIAL/PLATELET
BASO%: 0 % (ref 0.0–2.0)
Basophils Absolute: 0 10*3/uL (ref 0.0–0.1)
EOS ABS: 0 10*3/uL (ref 0.0–0.5)
EOS%: 0 % (ref 0.0–7.0)
HCT: 20.6 % — ABNORMAL LOW (ref 38.4–49.9)
HGB: 7.4 g/dL — ABNORMAL LOW (ref 13.0–17.1)
LYMPH#: 0.4 10*3/uL — AB (ref 0.9–3.3)
LYMPH%: 45.9 % (ref 14.0–49.0)
MCH: 29.8 pg (ref 27.2–33.4)
MCHC: 35.9 g/dL (ref 32.0–36.0)
MCV: 83.1 fL (ref 79.3–98.0)
MONO#: 0.5 10*3/uL (ref 0.1–0.9)
MONO%: 52.9 % — ABNORMAL HIGH (ref 0.0–14.0)
NEUT%: 1.2 % — ABNORMAL LOW (ref 39.0–75.0)
NEUTROS ABS: 0 10*3/uL — AB (ref 1.5–6.5)
PLATELETS: 41 10*3/uL — AB (ref 140–400)
RBC: 2.48 10*6/uL — AB (ref 4.20–5.82)
RDW: 12.4 % (ref 11.0–14.6)
WBC: 0.9 10*3/uL — AB (ref 4.0–10.3)
nRBC: 4 % — ABNORMAL HIGH (ref 0–0)

## 2014-08-07 LAB — HOLD TUBE, BLOOD BANK

## 2014-08-07 LAB — PREPARE RBC (CROSSMATCH)

## 2014-08-07 MED ORDER — SODIUM CHLORIDE 0.9 % IJ SOLN
10.0000 mL | INTRAMUSCULAR | Status: AC | PRN
  Administered 2014-08-07: 10 mL
  Filled 2014-08-07: qty 10

## 2014-08-07 MED ORDER — HEPARIN SOD (PORK) LOCK FLUSH 100 UNIT/ML IV SOLN
500.0000 [IU] | Freq: Every day | INTRAVENOUS | Status: AC | PRN
  Administered 2014-08-07: 500 [IU]
  Filled 2014-08-07: qty 5

## 2014-08-07 MED ORDER — DIPHENHYDRAMINE HCL 25 MG PO CAPS
ORAL_CAPSULE | ORAL | Status: AC
  Filled 2014-08-07: qty 2

## 2014-08-07 MED ORDER — ACETAMINOPHEN 325 MG PO TABS: 650.0000 mg | ORAL_TABLET | Freq: Once | ORAL | Status: DC

## 2014-08-07 MED ORDER — ACETAMINOPHEN 325 MG PO TABS
ORAL_TABLET | ORAL | Status: AC
  Filled 2014-08-07: qty 2

## 2014-08-07 MED ORDER — DIPHENHYDRAMINE HCL 25 MG PO CAPS: 25.0000 mg | ORAL_CAPSULE | Freq: Once | ORAL | Status: DC

## 2014-08-07 MED ORDER — SODIUM CHLORIDE 0.9 % IV SOLN
250.0000 mL | Freq: Once | INTRAVENOUS | Status: AC
  Administered 2014-08-07: 250 mL via INTRAVENOUS

## 2014-08-07 NOTE — Patient Instructions (Signed)

## 2014-08-08 LAB — TYPE AND SCREEN
ABO/RH(D): A NEG
ANTIBODY SCREEN: NEGATIVE
UNIT DIVISION: 0

## 2014-08-10 ENCOUNTER — Other Ambulatory Visit: Payer: Self-pay | Admitting: *Deleted

## 2014-08-10 ENCOUNTER — Other Ambulatory Visit (HOSPITAL_BASED_OUTPATIENT_CLINIC_OR_DEPARTMENT_OTHER): Payer: Self-pay

## 2014-08-10 ENCOUNTER — Ambulatory Visit: Payer: Self-pay

## 2014-08-10 DIAGNOSIS — C9200 Acute myeloblastic leukemia, not having achieved remission: Secondary | ICD-10-CM

## 2014-08-10 LAB — CBC WITH DIFFERENTIAL/PLATELET
BASO%: 0 % (ref 0.0–2.0)
BASOS ABS: 0 10*3/uL (ref 0.0–0.1)
EOS%: 0 % (ref 0.0–7.0)
Eosinophils Absolute: 0 10*3/uL (ref 0.0–0.5)
HCT: 25.2 % — ABNORMAL LOW (ref 38.4–49.9)
HEMOGLOBIN: 8.9 g/dL — AB (ref 13.0–17.1)
LYMPH%: 35.5 % (ref 14.0–49.0)
MCH: 30.4 pg (ref 27.2–33.4)
MCHC: 35.3 g/dL (ref 32.0–36.0)
MCV: 86 fL (ref 79.3–98.0)
MONO#: 0.7 10*3/uL (ref 0.1–0.9)
MONO%: 47.1 % — ABNORMAL HIGH (ref 0.0–14.0)
NEUT%: 17.4 % — ABNORMAL LOW (ref 39.0–75.0)
NEUTROS ABS: 0.2 10*3/uL — AB (ref 1.5–6.5)
Platelets: 80 10*3/uL — ABNORMAL LOW (ref 140–400)
RBC: 2.93 10*6/uL — ABNORMAL LOW (ref 4.20–5.82)
RDW: 13.1 % (ref 11.0–14.6)
WBC: 1.4 10*3/uL — AB (ref 4.0–10.3)
lymph#: 0.5 10*3/uL — ABNORMAL LOW (ref 0.9–3.3)

## 2014-08-10 LAB — HOLD TUBE, BLOOD BANK

## 2014-08-10 NOTE — Progress Notes (Signed)
Labs reviewed this morning. HGB is 8.9   Pt. States he feels well. Denies pain, SOB, dizzyness. VSS  No transfusion required per Dr. Alvy Bimler.  Pt. Made aware and is in agreement. To return Monday for scheduled appt.

## 2014-08-14 ENCOUNTER — Telehealth: Payer: Self-pay | Admitting: *Deleted

## 2014-08-14 ENCOUNTER — Ambulatory Visit (HOSPITAL_BASED_OUTPATIENT_CLINIC_OR_DEPARTMENT_OTHER): Payer: Self-pay | Admitting: Lab

## 2014-08-14 DIAGNOSIS — C9200 Acute myeloblastic leukemia, not having achieved remission: Secondary | ICD-10-CM

## 2014-08-14 LAB — COMPREHENSIVE METABOLIC PANEL (CC13)
ALBUMIN: 3.6 g/dL (ref 3.5–5.0)
ALT: 29 U/L (ref 0–55)
ANION GAP: 10 meq/L (ref 3–11)
AST: 41 U/L — ABNORMAL HIGH (ref 5–34)
Alkaline Phosphatase: 132 U/L (ref 40–150)
BUN: 10.7 mg/dL (ref 7.0–26.0)
CO2: 27 meq/L (ref 22–29)
Calcium: 8.8 mg/dL (ref 8.4–10.4)
Chloride: 105 mEq/L (ref 98–109)
Creatinine: 1 mg/dL (ref 0.7–1.3)
GLUCOSE: 110 mg/dL (ref 70–140)
POTASSIUM: 3.8 meq/L (ref 3.5–5.1)
SODIUM: 143 meq/L (ref 136–145)
TOTAL PROTEIN: 7.1 g/dL (ref 6.4–8.3)
Total Bilirubin: 0.46 mg/dL (ref 0.20–1.20)

## 2014-08-14 LAB — CBC WITH DIFFERENTIAL/PLATELET
BASO%: 0.5 % (ref 0.0–2.0)
BASOS ABS: 0 10*3/uL (ref 0.0–0.1)
EOS%: 0 % (ref 0.0–7.0)
Eosinophils Absolute: 0 10*3/uL (ref 0.0–0.5)
HCT: 28.3 % — ABNORMAL LOW (ref 38.4–49.9)
HEMOGLOBIN: 9.6 g/dL — AB (ref 13.0–17.1)
LYMPH#: 0.6 10*3/uL — AB (ref 0.9–3.3)
LYMPH%: 29.1 % (ref 14.0–49.0)
MCH: 30.3 pg (ref 27.2–33.4)
MCHC: 33.9 g/dL (ref 32.0–36.0)
MCV: 89.3 fL (ref 79.3–98.0)
MONO#: 0.5 10*3/uL (ref 0.1–0.9)
MONO%: 23.8 % — ABNORMAL HIGH (ref 0.0–14.0)
NEUT#: 0.9 10*3/uL — ABNORMAL LOW (ref 1.5–6.5)
NEUT%: 46.6 % (ref 39.0–75.0)
Platelets: 131 10*3/uL — ABNORMAL LOW (ref 140–400)
RBC: 3.17 10*6/uL — ABNORMAL LOW (ref 4.20–5.82)
RDW: 16.4 % — AB (ref 11.0–14.6)
WBC: 1.9 10*3/uL — ABNORMAL LOW (ref 4.0–10.3)
nRBC: 2 % — ABNORMAL HIGH (ref 0–0)

## 2014-08-14 LAB — HOLD TUBE, BLOOD BANK

## 2014-08-14 NOTE — Telephone Encounter (Signed)
-----   Message from Heath Lark, MD sent at 08/14/2014  8:58 AM EST ----- Regarding: no need blood   ----- Message -----    From: [facility] Interface    Sent: 08/14/2014   8:55 AM      To: Heath Lark, MD

## 2014-08-14 NOTE — Telephone Encounter (Signed)
Duplicate note opened in error  

## 2014-08-14 NOTE — Telephone Encounter (Signed)
S/w pt and his fiance in lobby.  Gave copy of his lab results and informed him  No need for transfusion today.  He was scheduled to come back in a few days this Thursday, but pt says he wants to go out of town for Christmas and asks if this is ok.  He will return for his appt on Monday 12/28 as scheduled.  This is ok w/ Dr. Alvy Bimler.

## 2014-08-17 ENCOUNTER — Other Ambulatory Visit: Payer: Self-pay

## 2014-08-21 ENCOUNTER — Other Ambulatory Visit (HOSPITAL_BASED_OUTPATIENT_CLINIC_OR_DEPARTMENT_OTHER): Payer: Self-pay

## 2014-08-21 ENCOUNTER — Telehealth: Payer: Self-pay | Admitting: *Deleted

## 2014-08-21 DIAGNOSIS — C9200 Acute myeloblastic leukemia, not having achieved remission: Secondary | ICD-10-CM

## 2014-08-21 LAB — COMPREHENSIVE METABOLIC PANEL (CC13)
ALBUMIN: 3.9 g/dL (ref 3.5–5.0)
ALT: 31 U/L (ref 0–55)
AST: 44 U/L — AB (ref 5–34)
Alkaline Phosphatase: 118 U/L (ref 40–150)
Anion Gap: 8 mEq/L (ref 3–11)
BUN: 7.9 mg/dL (ref 7.0–26.0)
CALCIUM: 9 mg/dL (ref 8.4–10.4)
CHLORIDE: 105 meq/L (ref 98–109)
CO2: 27 mEq/L (ref 22–29)
Creatinine: 1 mg/dL (ref 0.7–1.3)
EGFR: >90 mL/min/{1.73_m2} (ref >90)
Glucose: 112 mg/dl (ref 70–140)
POTASSIUM: 4.1 meq/L (ref 3.5–5.1)
Sodium: 139 mEq/L (ref 136–145)
Total Bilirubin: 0.58 mg/dL (ref 0.20–1.20)
Total Protein: 7.3 g/dL (ref 6.4–8.3)

## 2014-08-21 LAB — CBC WITH DIFFERENTIAL/PLATELET
BASO%: 0.4 % (ref 0.0–2.0)
BASOS ABS: 0 10*3/uL (ref 0.0–0.1)
EOS%: 0.6 % (ref 0.0–7.0)
Eosinophils Absolute: 0 10*3/uL (ref 0.0–0.5)
HEMATOCRIT: 35.4 % — AB (ref 38.4–49.9)
HEMOGLOBIN: 11.7 g/dL — AB (ref 13.0–17.1)
LYMPH#: 0.5 10*3/uL — AB (ref 0.9–3.3)
LYMPH%: 16.4 % (ref 14.0–49.0)
MCH: 31.1 pg (ref 27.2–33.4)
MCHC: 33.1 g/dL (ref 32.0–36.0)
MCV: 94 fL (ref 79.3–98.0)
MONO#: 0.5 10*3/uL (ref 0.1–0.9)
MONO%: 14.5 % — ABNORMAL HIGH (ref 0.0–14.0)
NEUT#: 2.2 10*3/uL (ref 1.5–6.5)
NEUT%: 68.1 % (ref 39.0–75.0)
PLATELETS: 164 10*3/uL (ref 140–400)
RBC: 3.77 10*6/uL — ABNORMAL LOW (ref 4.20–5.82)
RDW: 20.5 % — ABNORMAL HIGH (ref 11.0–14.6)
WBC: 3.2 10*3/uL — ABNORMAL LOW (ref 4.0–10.3)

## 2014-08-21 LAB — HOLD TUBE, BLOOD BANK

## 2014-08-21 NOTE — Telephone Encounter (Signed)
Notified that we are cancelling appts for Thursday due to very good counts

## 2014-08-24 ENCOUNTER — Other Ambulatory Visit: Payer: Self-pay

## 2014-08-28 ENCOUNTER — Telehealth: Payer: Self-pay | Admitting: *Deleted

## 2014-08-28 NOTE — Telephone Encounter (Signed)
Pt called to state he can't make his lab appt here today.  Informed pt he does not have any appts here this week.  His labs have improved.  He was unsure of when he is to return to [facility] for chemo. Informed him of appt in Epic for 1/7 at [facility] admission for chemo.  Gave him phone number for Dr. Linus Orn at [facility] and instructed him to call them today to confirm his admission on 1/7 and find out if he needs to have any further lab work done here prior to admission.   He will call them and call us back if he needs any further labs done prior to his next chemo.Derrick Callahan

## 2014-09-01 ENCOUNTER — Telehealth: Payer: Self-pay | Admitting: *Deleted

## 2014-09-01 NOTE — Telephone Encounter (Signed)
Call from [place] at [facility].  States pt will be d/c'd from [place] on 1/12.  They request lab appt w/ [place] on Thursday 1/14 and then Mondays and Thursdays.   She will fax over specific orders on Monday 1/11.

## 2014-09-04 ENCOUNTER — Other Ambulatory Visit: Payer: Self-pay | Admitting: Hematology and Oncology

## 2014-09-04 ENCOUNTER — Telehealth: Payer: Self-pay | Admitting: Hematology and Oncology

## 2014-09-04 NOTE — Telephone Encounter (Signed)
OK, will place POF

## 2014-09-05 ENCOUNTER — Telehealth: Payer: Self-pay | Admitting: *Deleted

## 2014-09-05 ENCOUNTER — Other Ambulatory Visit: Payer: Self-pay | Admitting: Hematology and Oncology

## 2014-09-05 NOTE — Telephone Encounter (Signed)
Derrick Huguenin, RN, at [facility] informs Dr. Linus Orn does Not want pt to have Neulasta injection as previously ordered.  I canceled pt's injection appt for Thursday.  She says they do want labs every Mon and Thursday thru Mon 2/15.  Pt scheduled to return to [facility] for chemo on 2/18.  Also does not need lab on Monday 2/1 as pt has appt at [facility] w/ Dr. Nadara Mustard that day.

## 2014-09-05 NOTE — Telephone Encounter (Signed)
I will put more orders after I see him tomorrow

## 2014-09-07 ENCOUNTER — Ambulatory Visit (HOSPITAL_BASED_OUTPATIENT_CLINIC_OR_DEPARTMENT_OTHER): Payer: Medicaid Other | Admitting: Hematology and Oncology

## 2014-09-07 ENCOUNTER — Telehealth: Payer: Self-pay | Admitting: Hematology and Oncology

## 2014-09-07 ENCOUNTER — Other Ambulatory Visit (HOSPITAL_BASED_OUTPATIENT_CLINIC_OR_DEPARTMENT_OTHER): Payer: Medicaid Other

## 2014-09-07 ENCOUNTER — Ambulatory Visit: Payer: Self-pay

## 2014-09-07 VITALS — BP 127/70 | HR 74 | Temp 98.0°F | Resp 18 | Ht 71.0 in | Wt 180.1 lb

## 2014-09-07 DIAGNOSIS — J17 Pneumonia in diseases classified elsewhere: Secondary | ICD-10-CM

## 2014-09-07 DIAGNOSIS — T50905A Adverse effect of unspecified drugs, medicaments and biological substances, initial encounter: Secondary | ICD-10-CM

## 2014-09-07 DIAGNOSIS — D701 Agranulocytosis secondary to cancer chemotherapy: Secondary | ICD-10-CM

## 2014-09-07 DIAGNOSIS — D630 Anemia in neoplastic disease: Secondary | ICD-10-CM

## 2014-09-07 DIAGNOSIS — C9200 Acute myeloblastic leukemia, not having achieved remission: Secondary | ICD-10-CM

## 2014-09-07 DIAGNOSIS — B49 Unspecified mycosis: Secondary | ICD-10-CM

## 2014-09-07 DIAGNOSIS — D6959 Other secondary thrombocytopenia: Secondary | ICD-10-CM

## 2014-09-07 DIAGNOSIS — T451X5A Adverse effect of antineoplastic and immunosuppressive drugs, initial encounter: Secondary | ICD-10-CM

## 2014-09-07 DIAGNOSIS — J168 Pneumonia due to other specified infectious organisms: Secondary | ICD-10-CM

## 2014-09-07 LAB — CBC WITH DIFFERENTIAL/PLATELET
BASO%: 0.6 % (ref 0.0–2.0)
Basophils Absolute: 0 10*3/uL (ref 0.0–0.1)
EOS%: 0.6 % (ref 0.0–7.0)
Eosinophils Absolute: 0 10*3/uL (ref 0.0–0.5)
HCT: 36.7 % — ABNORMAL LOW (ref 38.4–49.9)
HGB: 12.1 g/dL — ABNORMAL LOW (ref 13.0–17.1)
LYMPH#: 0.3 10*3/uL — AB (ref 0.9–3.3)
LYMPH%: 9.1 % — AB (ref 14.0–49.0)
MCH: 30.7 pg (ref 27.2–33.4)
MCHC: 33.1 g/dL (ref 32.0–36.0)
MCV: 92.8 fL (ref 79.3–98.0)
MONO#: 0 10*3/uL — ABNORMAL LOW (ref 0.1–0.9)
MONO%: 0.6 % (ref 0.0–14.0)
NEUT#: 2.5 10*3/uL (ref 1.5–6.5)
NEUT%: 89.1 % — ABNORMAL HIGH (ref 39.0–75.0)
Platelets: 46 10*3/uL — ABNORMAL LOW (ref 140–400)
RBC: 3.95 10*6/uL — AB (ref 4.20–5.82)
RDW: 16.5 % — ABNORMAL HIGH (ref 11.0–14.6)
WBC: 2.8 10*3/uL — ABNORMAL LOW (ref 4.0–10.3)

## 2014-09-07 LAB — COMPREHENSIVE METABOLIC PANEL (CC13)
ALK PHOS: 101 U/L (ref 40–150)
ALT: 40 U/L (ref 0–55)
AST: 42 U/L — AB (ref 5–34)
Albumin: 4.2 g/dL (ref 3.5–5.0)
Anion Gap: 9 mEq/L (ref 3–11)
BILIRUBIN TOTAL: 0.6 mg/dL (ref 0.20–1.20)
BUN: 19.2 mg/dL (ref 7.0–26.0)
CALCIUM: 9.3 mg/dL (ref 8.4–10.4)
CHLORIDE: 102 meq/L (ref 98–109)
CO2: 27 meq/L (ref 22–29)
Creatinine: 1 mg/dL (ref 0.7–1.3)
Glucose: 116 mg/dl (ref 70–140)
POTASSIUM: 4.1 meq/L (ref 3.5–5.1)
Sodium: 138 mEq/L (ref 136–145)
Total Protein: 7.6 g/dL (ref 6.4–8.3)

## 2014-09-07 LAB — HOLD TUBE, BLOOD BANK

## 2014-09-07 NOTE — Assessment & Plan Note (Signed)
This is likely due to recent treatment. The patient denies recent history of bleeding such as epistaxis, hematuria or hematochezia. He is asymptomatic from the low platelet count. The patient will need irradiated blood products. I recommend transfusion threshold of 15,000 for him

## 2014-09-07 NOTE — Assessment & Plan Note (Signed)
According to records from [facility], his recent CT scan showed resolution of the abnormal infiltrate. He will continue on medication with antifungal agent

## 2014-09-07 NOTE — Assessment & Plan Note (Signed)
This is likely due to recent treatment. The patient denies recent history of bleeding such as epistaxis, hematuria or hematochezia. He is asymptomatic from the anemia. I will observe for now.   We'll establish transfusion threshold to give him 1 unit of blood whenever his hemoglobin is less than 8 g. He needs irradiated blood products.

## 2014-09-07 NOTE — Assessment & Plan Note (Signed)
Per orders from [facility], we will not administer Neulasta this cycle

## 2014-09-07 NOTE — Assessment & Plan Note (Signed)
Overall, he tolerated treatment well with expected side effects. I will continue supportive care visits here. The patient will come in on Mondays and Thursdays every week for transfusion support. I will see him in February after his third consolidation chemotherapy.

## 2014-09-07 NOTE — Telephone Encounter (Signed)
Gave avs & cal for Jan/Feb. Sent mess to [facility].

## 2014-09-07 NOTE — Progress Notes (Signed)
[facility] OFFICE PROGRESS NOTE  Patient Care Team: Art Buff, MD as PCP - General (Hematology and Oncology)  SUMMARY OF ONCOLOGIC HISTORY: Oncology History    Diagnosis:  AML diagnosis and induction therapy given at outside hospital at the time of diagnosis:  CDP (05/25/14) WBC 128, Blasts 94%, ANC 0, Hb 5, Plt 9  BM Bx (05/26/14) AML with abnl myeloid phenotype   (pos) CD33, CD 45 (dim), CD38 (dim), MPO  (neg) CD34, CD56, CD10, HLA DR, TdT  nl karyotype  FLT-3 ITD (+), NPM1 (+), CEBPa (-)  Pulmonary Aspirgillosis  CT chest (06/10/14) with 1.5cm LUL lesion concerning for fungal infection  Treatment:  s/p leukopheresis; 05/25/14  s/p 7+3 at [facility]; 05/26/14  HPI:  AML diagnosed at [facility]  CDP WBC 128, Blasts 94%, ANC 0, Hb 5, Plt 9; 05/25/14  BM Bx AML with abnl myeloid phenotype; 05/26/14   leukopheresis; 05/25/14  7+3; 05/26/14  BM Bx with hypocellular marrow (5%) with no residual disease by morphology; 06/09/14  CT chest (06/10/14) with 1.5cm LUL lesion concerning for fungal infection  BM Bx (06/20/14) with hypercellular (90%) with no sign of malignancy by morphology  Established care at [facility]; 07/06/14  Cycle #1 consolidation with HiDAC; 07/13/14      AML (acute myelogenous leukemia)   05/26/2014 Cancer Diagnosis he was diagnosed with acute myelogenous leukemia   05/26/2014 - 06/02/2014 Chemotherapy he received induction chemotherapy with 7+3. he also received leukapheresis   06/09/2014 Bone Marrow Biopsy repeat bone marrow biopsy showed hypocellular marrow with 5%   06/10/2014 Imaging CT scan was abnormal concerning for fungal infection   06/20/2014 Bone Marrow Biopsy repeat bone marrow biopsy show no evidence of cancer   07/13/2014 - 07/17/2014 Chemotherapy he received cycle 1 consolidation therapy with hiDAC   08/31/2014 - 09/05/2014 Chemotherapy he received cycle 2 of hiDAC   09/02/2014 Imaging repeat CT scan show  resolution of lung infiltrate    INTERVAL HISTORY: Please see below for problem oriented charting. He is seen for supportive therapy at the cycle 2 of consolidation chemotherapy. He is doing well. Denies side effects from recent treatment. He had bone marrow transplant evaluation next month at [facility].  REVIEW OF SYSTEMS:   Constitutional: Denies fevers, chills or abnormal weight loss Eyes: Denies blurriness of vision Ears, nose, mouth, throat, and face: Denies mucositis or sore throat Respiratory: Denies cough, dyspnea or wheezes Cardiovascular: Denies palpitation, chest discomfort or lower extremity swelling Gastrointestinal:  Denies nausea, heartburn or change in bowel habits Skin: Denies abnormal skin rashes Lymphatics: Denies new lymphadenopathy or easy bruising Neurological:Denies numbness, tingling or new weaknesses Behavioral/Psych: Mood is stable, no new changes  All other systems were reviewed with the patient and are negative.  I have reviewed the past medical history, past surgical history, social history and family history with the patient and they are unchanged from previous note.  ALLERGIES:  has No Known Allergies.  MEDICATIONS:  Current Outpatient Prescriptions  Medication Sig Dispense Refill  . acyclovir (ZOVIRAX) 400 MG tablet Take 400 mg by mouth 2 (two) times daily.    Marland Kitchen amLODipine (NORVASC) 5 MG tablet Take 5 mg by mouth.    . hydrochlorothiazide (HYDRODIURIL) 25 MG tablet Take 25 mg by mouth daily.  5  . levofloxacin (LEVAQUIN) 500 MG tablet Take 500 mg by mouth daily.    Marland Kitchen lidocaine-prilocaine (EMLA) cream Apply 1 application topically as needed. Apply to porta cath site one hour prior to needle  stick. 30 g 6  . prochlorperazine (COMPAZINE) 10 MG tablet Take 10 mg by mouth every 6 (six) hours as needed for nausea or vomiting.    . voriconazole (VFEND) 200 MG tablet Take 200 mg by mouth 2 (two) times daily.     No current facility-administered  medications for this visit.    PHYSICAL EXAMINATION: ECOG PERFORMANCE STATUS: 0 - Asymptomatic  Filed Vitals:   09/07/14 1412  BP: 127/70  Pulse: 74  Temp: 98 F (36.7 C)  Resp: 18   Filed Weights   09/07/14 1412  Weight: 180 lb 1.6 oz (81.693 kg)    GENERAL:alert, no distress and comfortable SKIN: skin color, texture, turgor are normal, no rashes or significant lesions EYES: normal, Conjunctiva are pale and non-injected, sclera clear OROPHARYNX:no exudate, no erythema and lips, buccal mucosa, and tongue normal  NECK: supple, thyroid normal size, non-tender, without nodularity LYMPH:  no palpable lymphadenopathy in the cervical, axillary or inguinal LUNGS: clear to auscultation and percussion with normal breathing effort HEART: regular rate & rhythm and no murmurs and no lower extremity edema ABDOMEN:abdomen soft, non-tender and normal bowel sounds Musculoskeletal:no cyanosis of digits and no clubbing  NEURO: alert & oriented x 3 with fluent speech, no focal motor/sensory deficits  LABORATORY DATA:  I have reviewed the data as listed    Component Value Date/Time   NA 139 08/21/2014 0846   K 4.1 08/21/2014 0846   CO2 27 08/21/2014 0846   GLUCOSE 112 08/21/2014 0846   BUN 7.9 08/21/2014 0846   CREATININE 1.0 08/21/2014 0846   CALCIUM 9.0 08/21/2014 0846   PROT 7.3 08/21/2014 0846   ALBUMIN 3.9 08/21/2014 0846   AST 44* 08/21/2014 0846   ALT 31 08/21/2014 0846   ALKPHOS 118 08/21/2014 0846   BILITOT 0.58 08/21/2014 0846    No results found for: SPEP, UPEP  Lab Results  Component Value Date   WBC 2.8* 09/07/2014   NEUTROABS 2.5 09/07/2014   HGB 12.1* 09/07/2014   HCT 36.7* 09/07/2014   MCV 92.8 09/07/2014   PLT 46* 09/07/2014      Chemistry      Component Value Date/Time   NA 139 08/21/2014 0846   K 4.1 08/21/2014 0846   CO2 27 08/21/2014 0846   BUN 7.9 08/21/2014 0846   CREATININE 1.0 08/21/2014 0846      Component Value Date/Time   CALCIUM 9.0  08/21/2014 0846   ALKPHOS 118 08/21/2014 0846   AST 44* 08/21/2014 0846   ALT 31 08/21/2014 0846   BILITOT 0.58 08/21/2014 0846     ASSESSMENT & PLAN:  AML (acute myelogenous leukemia) Overall, he tolerated treatment well with expected side effects. I will continue supportive care visits here. The patient will come in on Mondays and Thursdays every week for transfusion support. I will see him in February after his third consolidation chemotherapy.     Anemia in neoplastic disease This is likely due to recent treatment. The patient denies recent history of bleeding such as epistaxis, hematuria or hematochezia. He is asymptomatic from the anemia. I will observe for now.   We'll establish transfusion threshold to give him 1 unit of blood whenever his hemoglobin is less than 8 g. He needs irradiated blood products.   Fungal pneumonia According to records from [facility], his recent CT scan showed resolution of the abnormal infiltrate. He will continue on medication with antifungal agent   Leukopenia due to antineoplastic chemotherapy Per orders from [facility], we will  not administer Neulasta this cycle   Thrombocytopenia due to drugs This is likely due to recent treatment. The patient denies recent history of bleeding such as epistaxis, hematuria or hematochezia. He is asymptomatic from the low platelet count. The patient will need irradiated blood products. I recommend transfusion threshold of 15,000 for him      I reviewed twenty pages of records from [facility]. All questions were answered. The patient knows to call the clinic with any problems, questions or concerns. No barriers to learning was detected. I spent 30 minutes counseling the patient face to face. The total time spent in the appointment was 40 minutes and more than 50% was on counseling and review of test results     [facility], [place], MD 09/07/2014 2:32 PM

## 2014-09-11 ENCOUNTER — Other Ambulatory Visit: Payer: Medicaid Other

## 2014-09-12 ENCOUNTER — Telehealth: Payer: Self-pay | Admitting: *Deleted

## 2014-09-12 ENCOUNTER — Telehealth: Payer: Self-pay | Admitting: Hematology and Oncology

## 2014-09-12 NOTE — Telephone Encounter (Signed)
Blood transfusion will be added as needed.

## 2014-09-12 NOTE — Telephone Encounter (Signed)
Pt did not show for his scheduled lab yesterday.  I attempted to call pt and his phone rang and rang w/ no answer.   Called Dorise Hiss, RN at [facility] to notify pt did not show up for his lab appt and I cannot reach him.  She will check to make sure he is not at their hospital and call nurse here back.

## 2014-09-12 NOTE — Telephone Encounter (Signed)
Vivian RN from [facility] says pt is not over there.  She gave me another contact number for pt at [place] [facility].    I left a VM at that number asking for pt to return nurses call.  He needs to come in for lab as soon as possible.  He missed his lab appt here yesterday.

## 2014-09-13 ENCOUNTER — Telehealth: Payer: Self-pay | Admitting: *Deleted

## 2014-09-13 NOTE — Telephone Encounter (Signed)
Called pt again regarding his missed lab appt on Monday 1/18.   He says he is aware but was unable to make it.  Asked if he could come in today for labs and he said he can't make it but he will be here tomorrow as scheduled.  Informed pt it is very important to keep his lab appts as scheduled so we can monitor his blood after chemotherapy.  He verbalized understanding and states he will come in the morning.  I scheduled appt for transfusion tomorrow at [facility] as there is no availability here tomorrow.    I notified Adonis Huguenin, RN at [facility] of above.

## 2014-09-14 ENCOUNTER — Other Ambulatory Visit (HOSPITAL_BASED_OUTPATIENT_CLINIC_OR_DEPARTMENT_OTHER): Payer: Medicaid Other

## 2014-09-14 ENCOUNTER — Ambulatory Visit (HOSPITAL_COMMUNITY)
Admission: RE | Admit: 2014-09-14 | Discharge: 2014-09-14 | Disposition: A | Discharge status: Home / Self Care | Payer: Medicaid Other | Source: Ambulatory Visit | Attending: Hematology and Oncology | Admitting: Hematology and Oncology

## 2014-09-14 ENCOUNTER — Telehealth: Payer: Self-pay | Admitting: *Deleted

## 2014-09-14 ENCOUNTER — Other Ambulatory Visit: Payer: Self-pay | Admitting: Hematology and Oncology

## 2014-09-14 ENCOUNTER — Encounter: Payer: Self-pay | Admitting: *Deleted

## 2014-09-14 VITALS — BP 123/77 | HR 73 | Temp 98.4°F | Resp 20

## 2014-09-14 DIAGNOSIS — C9200 Acute myeloblastic leukemia, not having achieved remission: Secondary | ICD-10-CM

## 2014-09-14 DIAGNOSIS — D630 Anemia in neoplastic disease: Secondary | ICD-10-CM

## 2014-09-14 LAB — CBC WITH DIFFERENTIAL/PLATELET
BASO%: 0 % (ref 0.0–2.0)
Basophils Absolute: 0 10*3/uL (ref 0.0–0.1)
EOS ABS: 0 10*3/uL (ref 0.0–0.5)
EOS%: 0 % (ref 0.0–7.0)
HCT: 31.4 % — ABNORMAL LOW (ref 38.4–49.9)
HGB: 11.4 g/dL — ABNORMAL LOW (ref 13.0–17.1)
LYMPH#: 0.4 10*3/uL — AB (ref 0.9–3.3)
LYMPH%: 45.7 % (ref 14.0–49.0)
MCH: 31.1 pg (ref 27.2–33.4)
MCHC: 36.3 g/dL — AB (ref 32.0–36.0)
MCV: 85.8 fL (ref 79.3–98.0)
MONO#: 0 10*3/uL — ABNORMAL LOW (ref 0.1–0.9)
MONO%: 2.2 % (ref 0.0–14.0)
NEUT%: 52.1 % (ref 39.0–75.0)
NEUTROS ABS: 0.5 10*3/uL — AB (ref 1.5–6.5)
Platelets: 2 10*3/uL — CL (ref 140–400)
RBC: 3.66 10*6/uL — ABNORMAL LOW (ref 4.20–5.82)
RDW: 13.7 % (ref 11.0–14.6)
WBC: 0.9 10*3/uL — CL (ref 4.0–10.3)
nRBC: 0 % (ref 0–0)

## 2014-09-14 LAB — COMPREHENSIVE METABOLIC PANEL (CC13)
ALBUMIN: 4.2 g/dL (ref 3.5–5.0)
ALT: 49 U/L (ref 0–55)
AST: 38 U/L — ABNORMAL HIGH (ref 5–34)
Alkaline Phosphatase: 106 U/L (ref 40–150)
Anion Gap: 8 mEq/L (ref 3–11)
BUN: 23.2 mg/dL (ref 7.0–26.0)
CO2: 27 meq/L (ref 22–29)
Calcium: 9.5 mg/dL (ref 8.4–10.4)
Chloride: 105 mEq/L (ref 98–109)
Creatinine: 0.9 mg/dL (ref 0.7–1.3)
GLUCOSE: 116 mg/dL (ref 70–140)
POTASSIUM: 4.3 meq/L (ref 3.5–5.1)
Sodium: 140 mEq/L (ref 136–145)
Total Bilirubin: 0.6 mg/dL (ref 0.20–1.20)
Total Protein: 7.5 g/dL (ref 6.4–8.3)

## 2014-09-14 LAB — MAGNESIUM (CC13): Magnesium: 2.4 mg/dl (ref 1.5–2.5)

## 2014-09-14 LAB — HOLD TUBE, BLOOD BANK

## 2014-09-14 MED ORDER — SODIUM CHLORIDE 0.9 % IJ SOLN
10.0000 mL | INTRAMUSCULAR | Status: AC | PRN
  Administered 2014-09-14: 10 mL

## 2014-09-14 MED ORDER — HEPARIN SOD (PORK) LOCK FLUSH 100 UNIT/ML IV SOLN: 250.0000 [IU] | INTRAVENOUS | Status: DC | PRN

## 2014-09-14 MED ORDER — HEPARIN SOD (PORK) LOCK FLUSH 100 UNIT/ML IV SOLN
500.0000 [IU] | Freq: Every day | INTRAVENOUS | Status: AC | PRN
  Administered 2014-09-14: 500 [IU]
  Filled 2014-09-14: qty 5

## 2014-09-14 MED ORDER — SODIUM CHLORIDE 0.9 % IJ SOLN: 3.0000 mL | INTRAMUSCULAR | Status: DC | PRN

## 2014-09-14 MED ORDER — DIPHENHYDRAMINE HCL 25 MG PO CAPS
25.0000 mg | ORAL_CAPSULE | Freq: Once | ORAL | Status: AC
  Administered 2014-09-14: 25 mg via ORAL
  Filled 2014-09-14: qty 1

## 2014-09-14 MED ORDER — SODIUM CHLORIDE 0.9 % IV SOLN
250.0000 mL | Freq: Once | INTRAVENOUS | Status: AC
  Administered 2014-09-14: 250 mL via INTRAVENOUS

## 2014-09-14 MED ORDER — ACETAMINOPHEN 325 MG PO TABS
650.0000 mg | ORAL_TABLET | Freq: Once | ORAL | Status: AC
  Administered 2014-09-14: 650 mg via ORAL
  Filled 2014-09-14: qty 2

## 2014-09-14 NOTE — Progress Notes (Signed)
S/w pt in lobby.  Informed him of CBC results.  Informed him Dr. Alvy Bimler has ordered one unit of Platelets today and he is scheduled at [facility].  Gave him map and directions to [facility].  Instructed on Neutropenic precautions and bleeding precautions.  Instructed him to go to ED for any bleeding that does not stop.  Instructed him to keep his lab appts as scheduled.  It is dangerous for him to not allow [place] to check his labs after chemotherapy.  He verbalized understanding.   Lab Results faxed to Dr. Linus Orn at [facility] fax [phone].

## 2014-09-14 NOTE — Telephone Encounter (Signed)
Notified Kim at [facility] of pt's lab results today and he will be transfused one unit of platelets today.

## 2014-09-14 NOTE — Procedures (Signed)
[facility]  Procedure Note  Derrick Callahan XBW:[id number] DOB: 1971-01-22 DOA: 09/14/2014   PCP: Art Buff, MD   Associated Diagnosis: AML (acute myelogenous leukemia)  Procedure Note: Infusion of 1 unit platelets   Condition During Procedure:  Pt tolerated well   Condition at Discharge: Pt alert, oriented, ambulatory; port deaccessed and flushed; no complications noted   Tamala Julian, Salley Slaughter, [facility]

## 2014-09-15 LAB — PREPARE PLATELET PHERESIS: Unit division: 0

## 2014-09-18 ENCOUNTER — Ambulatory Visit (HOSPITAL_BASED_OUTPATIENT_CLINIC_OR_DEPARTMENT_OTHER): Payer: Medicaid Other

## 2014-09-18 ENCOUNTER — Other Ambulatory Visit: Payer: Self-pay | Admitting: Hematology and Oncology

## 2014-09-18 ENCOUNTER — Other Ambulatory Visit (HOSPITAL_BASED_OUTPATIENT_CLINIC_OR_DEPARTMENT_OTHER): Payer: Medicaid Other

## 2014-09-18 ENCOUNTER — Other Ambulatory Visit: Payer: Self-pay | Admitting: *Deleted

## 2014-09-18 VITALS — BP 113/65 | HR 69 | Temp 97.8°F | Resp 18

## 2014-09-18 DIAGNOSIS — C9200 Acute myeloblastic leukemia, not having achieved remission: Secondary | ICD-10-CM | POA: Diagnosis not present

## 2014-09-18 DIAGNOSIS — D630 Anemia in neoplastic disease: Secondary | ICD-10-CM

## 2014-09-18 LAB — MAGNESIUM (CC13): Magnesium: 2.3 mg/dl (ref 1.5–2.5)

## 2014-09-18 LAB — CBC WITH DIFFERENTIAL/PLATELET
BASO%: 0 % (ref 0.0–2.0)
BASOS ABS: 0 10*3/uL (ref 0.0–0.1)
EOS%: 0.6 % (ref 0.0–7.0)
Eosinophils Absolute: 0 10*3/uL (ref 0.0–0.5)
HEMATOCRIT: 29.9 % — AB (ref 38.4–49.9)
HEMOGLOBIN: 10.2 g/dL — AB (ref 13.0–17.1)
LYMPH%: 94.9 % — ABNORMAL HIGH (ref 14.0–49.0)
MCH: 30.2 pg (ref 27.2–33.4)
MCHC: 34 g/dL (ref 32.0–36.0)
MCV: 89 fL (ref 79.3–98.0)
MONO#: 0 10*3/uL — AB (ref 0.1–0.9)
MONO%: 1.5 % (ref 0.0–14.0)
NEUT#: 0 10*3/uL — CL (ref 1.5–6.5)
NEUT%: 3 % — AB (ref 39.0–75.0)
Platelets: 13 10*3/uL — ABNORMAL LOW (ref 140–400)
RBC: 3.36 10*6/uL — AB (ref 4.20–5.82)
RDW: 15.6 % — AB (ref 11.0–14.6)
WBC: 0.4 10*3/uL — CL (ref 4.0–10.3)
lymph#: 0.4 10*3/uL — ABNORMAL LOW (ref 0.9–3.3)

## 2014-09-18 LAB — COMPREHENSIVE METABOLIC PANEL (CC13)
ALK PHOS: 119 U/L (ref 40–150)
ALT: 46 U/L (ref 0–55)
AST: 41 U/L — ABNORMAL HIGH (ref 5–34)
Albumin: 4.4 g/dL (ref 3.5–5.0)
Anion Gap: 9 mEq/L (ref 3–11)
BUN: 26.7 mg/dL — ABNORMAL HIGH (ref 7.0–26.0)
CALCIUM: 9.5 mg/dL (ref 8.4–10.4)
CO2: 29 mEq/L (ref 22–29)
Chloride: 103 mEq/L (ref 98–109)
Creatinine: 1.1 mg/dL (ref 0.7–1.3)
GLUCOSE: 106 mg/dL (ref 70–140)
POTASSIUM: 3.9 meq/L (ref 3.5–5.1)
Sodium: 141 mEq/L (ref 136–145)
Total Bilirubin: 0.58 mg/dL (ref 0.20–1.20)
Total Protein: 7.9 g/dL (ref 6.4–8.3)

## 2014-09-18 LAB — HOLD TUBE, BLOOD BANK

## 2014-09-18 MED ORDER — ACETAMINOPHEN 325 MG PO TABS
ORAL_TABLET | ORAL | Status: AC
  Filled 2014-09-18: qty 2

## 2014-09-18 MED ORDER — SODIUM CHLORIDE 0.9 % IJ SOLN
10.0000 mL | INTRAMUSCULAR | Status: AC | PRN
  Administered 2014-09-18: 10 mL
  Filled 2014-09-18: qty 10

## 2014-09-18 MED ORDER — DIPHENHYDRAMINE HCL 25 MG PO CAPS
25.0000 mg | ORAL_CAPSULE | Freq: Once | ORAL | Status: AC
  Administered 2014-09-18: 25 mg via ORAL

## 2014-09-18 MED ORDER — DIPHENHYDRAMINE HCL 25 MG PO CAPS
ORAL_CAPSULE | ORAL | Status: AC
  Filled 2014-09-18: qty 1

## 2014-09-18 MED ORDER — SODIUM CHLORIDE 0.9 % IV SOLN
250.0000 mL | Freq: Once | INTRAVENOUS | Status: AC
  Administered 2014-09-18: 250 mL via INTRAVENOUS

## 2014-09-18 MED ORDER — HEPARIN SOD (PORK) LOCK FLUSH 100 UNIT/ML IV SOLN
500.0000 [IU] | Freq: Every day | INTRAVENOUS | Status: AC | PRN
  Administered 2014-09-18: 500 [IU]
  Filled 2014-09-18: qty 5

## 2014-09-18 MED ORDER — ACETAMINOPHEN 325 MG PO TABS
650.0000 mg | ORAL_TABLET | Freq: Once | ORAL | Status: AC
  Administered 2014-09-18: 650 mg via ORAL

## 2014-09-18 NOTE — Patient Instructions (Signed)
Platelet Transfusion Information This is information about transfusions of platelets. Platelets are tiny cells made by the bone marrow and found in the blood. When a blood vessel is damaged, platelets rush to the damaged area to help form a clot. This begins the healing process. When platelets get very low, your blood may have trouble clotting. This may be from:  Illness.  Blood disorder.  Chemotherapy to treat cancer. Often, lower platelet counts do not cause problems.  Platelets usually last for 7 to 10 days. If they are not used in an injury, they are broken down by the liver or spleen. Symptoms of low platelet count include:  Nosebleeds.  Bleeding gums.  Heavy periods.  Bruising and tiny blood spots in the skin.  Pinpoint spots of bleeding (petechiae).  Larger bruises (purpura).  Bleeding can be more serious if it happens in the brain or bowel. Platelet transfusions are often used to keep the platelet count at an acceptable level. Serious bleeding due to low platelets is uncommon. RISKS AND COMPLICATIONS Severe side effects from platelet transfusions are uncommon. Minor reactions may include:  Itching.  Rashes.  High temperature and shivering. Medications are available to stop transfusion reactions. Let your health care provider know if you develop any of the above problems.  If you are having platelet transfusions frequently, they may get less effective. This is called becoming refractory to platelets. It is uncommon. This can happen from non-immune causes and immune causes. Non-immune causes include:  High temperatures.  Some medications.  An enlarged spleen. Immune causes happen when your body discovers the platelets are not your own and begins making antibodies against them. The antibodies kill the platelets quickly. Even with platelet transfusions, you may still notice problems with bleeding or bruising. Let your health care providers know about this. Other things  can be done to help if this happens.  BEFORE THE PROCEDURE   Your health care provider will check your platelet count regularly.  If the platelet count is too low, it may be necessary to have a platelet transfusion.  This is more important before certain procedures with a risk of bleeding, such as a spinal tap.  Platelet transfusion reduces the risk of bleeding during or after the procedure.  Except in emergencies, giving a transfusion requires a written consent. Before blood is taken from a donor, a complete history is taken to make sure the person has no history of previous diseases, nor engages in risky social behavior. Examples of this are intravenous drug use or sexual activity with multiple partners. This could lead to infected blood or blood products being used. This history is taken in spite of the extensive testing to make sure the blood is safe. All blood products transfused are tested to make sure it is a match for the person getting the blood. It is also checked for infections. Blood is the safest it has ever been. The risk of getting an infection is very low. PROCEDURE  The platelets are stored in small plastic bags that are kept at a low temperature.  Each bag is called a unit and sometimes two units are given. They are given through an intravenous line by drip infusion over about one-half hour.  Usually blood is collected from multiple people to get enough to transfuse.  Sometimes, the platelets are collected from a single person. This is done using a special machine that separates the platelets from the blood. The machine is called an apheresis machine. Platelets collected in this   way are called apheresed platelets. Apheresed platelets reduce the risk of becoming sensitive to the platelets. This lowers the chances of having a transfusion reaction.  As it only takes a short time to give the platelets, this treatment can be given in an outpatient department. Platelets can also be  given before or after other treatments. SEEK IMMEDIATE MEDICAL CARE IF: You have any of the following symptoms over the next 12 hours or several days:  Shaking chills.  Fever with a temperature greater than 102F (38.9C) develops.  Back pain or muscle pain.  People around you feel you are not acting correctly, or you are confused.  Blood in the urine or bowel movements, or bleeding from any place in your body.  Shortness of breath, or difficulty breathing.  Dizziness.  Fainting.  You break out in a rash or develop hives.  Decrease in the amount of urine you are putting out, or the urine turns a dark color or changes to pink, red, or brown.  A severe headache or stiff neck.  Bruising more easily. Document Released: 06/08/2007 Document Revised: 12/26/2013 Document Reviewed: 06/08/2007 [facility] Patient Information 2015 [place], [place]. This information is not intended to replace advice given to you by your health care provider. Make sure you discuss any questions you have with your health care provider.  Neutropenia Neutropenia is a condition that occurs when the level of a certain type of white blood cell (neutrophil) in your body becomes lower than normal. Neutrophils are made in the bone marrow and fight infections. These cells protect against bacteria and viruses. The fewer neutrophils you have, and the longer your body remains without them, the greater your risk of getting a severe infection becomes. CAUSES  The cause of neutropenia may be hard to determine. However, it is usually due to 3 main problems:   Decreased production of neutrophils. This may be due to:  Certain medicines such as chemotherapy.  Genetic problems.  Cancer.  Radiation treatments.  Vitamin deficiency.  Some pesticides.  Increased destruction of neutrophils. This may be due to:  Overwhelming infections.  Hemolytic anemia. This is when the body destroys its own blood  cells.  Chemotherapy.  Neutrophils moving to areas of the body where they cannot fight infections. This may be due to:  Dialysis procedures.  Conditions where the spleen becomes enlarged. Neutrophils are held in the spleen and are not available to the rest of the body.  Overwhelming infections. The neutrophils are held in the area of the infection and are not available to the rest of the body. SYMPTOMS  There are no specific symptoms of neutropenia. The lack of neutrophils can result in an infection, and an infection can cause various problems. DIAGNOSIS  Diagnosis is made by a blood test. A complete blood count is performed. The normal level of neutrophils in human blood differs with age and race. Infants have lower counts than older children and adults. African Americans have lower counts than Caucasians or Asians. The average adult level is 1500 cells/mm3 of blood. Neutrophil counts are interpreted as follows:  Greater than 1000 cells/mm3 gives normal protection against infection.  500 to 1000 cells/mm3 gives an increased risk for infection.  200 to 500 cells/mm3 is a greater risk for severe infection.  Lower than 200 cells/mm3 is a marked risk of infection. This may require hospitalization and treatment with antibiotic medicines. TREATMENT  Treatment depends on the underlying cause, severity, and presence of infections or symptoms. It also depends on your  health. Your caregiver will discuss the treatment plan with you. Mild cases are often easily treated and have a good outcome. Preventative measures may also be started to limit your risk of infections. Treatment can include:  Taking antibiotics.  Stopping medicines that are known to cause neutropenia.  Correcting nutritional deficiencies by eating green vegetables to supply folic acid and taking vitamin B supplements.  Stopping exposure to pesticides if your neutropenia is related to pesticide exposure.  Taking a blood growth  factor called sargramostim, pegfilgrastim, or filgrastim if you are undergoing chemotherapy for cancer. This stimulates white blood cell production.  Removal of the spleen if you have Felty's syndrome and have repeated infections. HOME CARE INSTRUCTIONS   Follow your caregiver's instructions about when you need to have blood work done.  Wash your hands often. Make sure others who come in contact with you also wash their hands.  Wash raw fruits and vegetables before eating them. They can carry bacteria and fungi.  Avoid people with colds or spreadable (contagious) diseases (chickenpox, herpes zoster, influenza).  Avoid large crowds.  Avoid construction areas. The dust can release fungus into the air.  Be cautious around children in daycare or school environments.  Take care of your respiratory system by coughing and deep breathing.  Bathe daily.  Protect your skin from cuts and burns.  Do not work in the garden or with flowers and plants.  Care for the mouth before and after meals by brushing with a soft toothbrush. If you have mucositis, do not use mouthwash. Mouthwash contains alcohol and can dry out the mouth even more.  Clean the area between the genitals and the anus (perineal area) after urination and bowel movements. Women need to wipe from front to back.  Use a water soluble lubricant during sexual intercourse and practice good hygiene after. Do not have intercourse if you are severely neutropenic. Check with your caregiver for guidelines.  Exercise daily as tolerated.  Avoid people who were vaccinated with a live vaccine in the past 30 days. You should not receive live vaccines (polio, typhoid).  Do not provide direct care for pets. Avoid animal droppings. Do not clean litter boxes and bird cages.  Do not share food utensils.  Do not use tampons, enemas, or rectal suppositories unless directed by your caregiver.  Use an electric razor to remove hair.  Wash your  hands after handling magazines, letters, and newspapers. SEEK IMMEDIATE MEDICAL CARE IF:   You have a fever.  You have chills or start to shake.  You feel nauseous or vomit.  You develop mouth sores.  You develop aches and pains.  You have redness and swelling around open wounds.  Your skin is warm to the touch.  You have pus coming from your wounds.  You develop swollen lymph nodes.  You feel weak or fatigued.  You develop red streaks on the skin. MAKE SURE YOU:  Understand these instructions.  Will watch your condition.  Will get help right away if you are not doing well or get worse. Document Released: 01/31/2002 Document Revised: 11/03/2011 Document Reviewed: 02/28/2011 [facility] Patient Information 2015 [place], [place]. This information is not intended to replace advice given to you by your health care provider. Make sure you discuss any questions you have with your health care provider.

## 2014-09-18 NOTE — Progress Notes (Signed)
S/w pt in lobby.  Informed him of order for one unit of platelets per Dr. Alvy Bimler and per parameters from [place] him to return to clinic at 10:45 am today for Platelet transfusion and he verbalized understanding.  Pt confirmed his appt for this Thursday,  Anticipate he may need blood transfusion by Thursday and request sent to Scheduler to add on transfusion this Thursday 1/28 and following Thursday 2/4.   Pt denies any bleeding, fevers but states he does feel fatigued.  He is aware of low ANC and need for Neutropenic precautions.   Faxed CBC results to [facility] at fax [phone].

## 2014-09-19 LAB — PREPARE PLATELET PHERESIS: Unit division: 0

## 2014-09-21 ENCOUNTER — Other Ambulatory Visit (HOSPITAL_BASED_OUTPATIENT_CLINIC_OR_DEPARTMENT_OTHER): Payer: Medicaid Other

## 2014-09-21 ENCOUNTER — Encounter: Payer: Self-pay | Admitting: *Deleted

## 2014-09-21 DIAGNOSIS — D630 Anemia in neoplastic disease: Secondary | ICD-10-CM

## 2014-09-21 DIAGNOSIS — D6959 Other secondary thrombocytopenia: Secondary | ICD-10-CM

## 2014-09-21 DIAGNOSIS — C9200 Acute myeloblastic leukemia, not having achieved remission: Secondary | ICD-10-CM

## 2014-09-21 DIAGNOSIS — D701 Agranulocytosis secondary to cancer chemotherapy: Secondary | ICD-10-CM

## 2014-09-21 LAB — COMPREHENSIVE METABOLIC PANEL (CC13)
ALBUMIN: 4.3 g/dL (ref 3.5–5.0)
ALK PHOS: 125 U/L (ref 40–150)
ALT: 34 U/L (ref 0–55)
ANION GAP: 11 meq/L (ref 3–11)
AST: 30 U/L (ref 5–34)
BUN: 21.1 mg/dL (ref 7.0–26.0)
CALCIUM: 9.8 mg/dL (ref 8.4–10.4)
CO2: 28 mEq/L (ref 22–29)
CREATININE: 1 mg/dL (ref 0.7–1.3)
Chloride: 103 mEq/L (ref 98–109)
EGFR: >90 mL/min/{1.73_m2} (ref >90)
GLUCOSE: 118 mg/dL (ref 70–140)
Potassium: 3.9 mEq/L (ref 3.5–5.1)
Sodium: 141 mEq/L (ref 136–145)
Total Bilirubin: 0.67 mg/dL (ref 0.20–1.20)
Total Protein: 8 g/dL (ref 6.4–8.3)

## 2014-09-21 LAB — CBC WITH DIFFERENTIAL/PLATELET
BASO%: 0 % (ref 0.0–2.0)
Basophils Absolute: 0 10*3/uL (ref 0.0–0.1)
EOS%: 0 % (ref 0.0–7.0)
Eosinophils Absolute: 0 10*3/uL (ref 0.0–0.5)
HEMATOCRIT: 27.7 % — AB (ref 38.4–49.9)
HGB: 10 g/dL — ABNORMAL LOW (ref 13.0–17.1)
LYMPH%: 87.5 % — ABNORMAL HIGH (ref 14.0–49.0)
MCH: 30.4 pg (ref 27.2–33.4)
MCHC: 36.1 g/dL — AB (ref 32.0–36.0)
MCV: 84.2 fL (ref 79.3–98.0)
MONO#: 0.1 10*3/uL (ref 0.1–0.9)
MONO%: 12.5 % (ref 0.0–14.0)
NEUT#: 0 10*3/uL — CL (ref 1.5–6.5)
NEUT%: 0 % — ABNORMAL LOW (ref 39.0–75.0)
PLATELETS: 17 10*3/uL — AB (ref 140–400)
RBC: 3.29 10*6/uL — ABNORMAL LOW (ref 4.20–5.82)
RDW: 13.4 % (ref 11.0–14.6)
WBC: 0.4 10*3/uL — AB (ref 4.0–10.3)
lymph#: 0.4 10*3/uL — ABNORMAL LOW (ref 0.9–3.3)

## 2014-09-21 LAB — HOLD TUBE, BLOOD BANK

## 2014-09-21 LAB — MAGNESIUM (CC13): MAGNESIUM: 2.5 mg/dL (ref 1.5–2.5)

## 2014-09-21 NOTE — Progress Notes (Signed)
S/w pt in lobby this morning.  Informed him of CBC results and no need for transfusion per Dr. Alvy Bimler.  Pt denies any bleeding. Pt confirmed he has appt at [facility] on Monday 2/1 so we do not have him scheduled here for lab again until Thursday 2/4.   Instructed pt to let us know if his appts change and he does need lab done here before 2/4.Marland Kitchen  He verbalized understanding.   Lab results faxed to [facility] at fax [phone].

## 2014-09-25 ENCOUNTER — Other Ambulatory Visit: Payer: Medicaid Other

## 2014-09-25 ENCOUNTER — Ambulatory Visit: Payer: Medicaid Other | Admitting: Hematology and Oncology

## 2014-09-25 ENCOUNTER — Ambulatory Visit (HOSPITAL_COMMUNITY)
Admission: RE | Admit: 2014-09-25 | Discharge: 2014-09-25 | Disposition: A | Discharge status: Home / Self Care | Payer: Medicaid Other | Source: Ambulatory Visit | Attending: Hematology and Oncology | Admitting: Hematology and Oncology

## 2014-09-28 ENCOUNTER — Encounter: Payer: Self-pay | Admitting: *Deleted

## 2014-09-28 ENCOUNTER — Other Ambulatory Visit (HOSPITAL_BASED_OUTPATIENT_CLINIC_OR_DEPARTMENT_OTHER): Payer: Medicaid Other

## 2014-09-28 DIAGNOSIS — C9200 Acute myeloblastic leukemia, not having achieved remission: Secondary | ICD-10-CM | POA: Diagnosis present

## 2014-09-28 LAB — CBC WITH DIFFERENTIAL/PLATELET
BASO%: 0 % (ref 0.0–2.0)
Basophils Absolute: 0 10*3/uL (ref 0.0–0.1)
EOS%: 0 % (ref 0.0–7.0)
Eosinophils Absolute: 0 10*3/uL (ref 0.0–0.5)
HCT: 24 % — ABNORMAL LOW (ref 38.4–49.9)
HEMOGLOBIN: 8.7 g/dL — AB (ref 13.0–17.1)
LYMPH%: 30.7 % (ref 14.0–49.0)
MCH: 30.7 pg (ref 27.2–33.4)
MCHC: 36.3 g/dL — AB (ref 32.0–36.0)
MCV: 84.8 fL (ref 79.3–98.0)
MONO#: 0.5 10*3/uL (ref 0.1–0.9)
MONO%: 37.1 % — AB (ref 0.0–14.0)
NEUT%: 32.2 % — AB (ref 39.0–75.0)
NEUTROS ABS: 0.5 10*3/uL — AB (ref 1.5–6.5)
PLATELETS: 23 10*3/uL — AB (ref 140–400)
RBC: 2.83 10*6/uL — ABNORMAL LOW (ref 4.20–5.82)
RDW: 13.5 % (ref 11.0–14.6)
WBC: 1.4 10*3/uL — ABNORMAL LOW (ref 4.0–10.3)
lymph#: 0.4 10*3/uL — ABNORMAL LOW (ref 0.9–3.3)
nRBC: 0 % (ref 0–0)

## 2014-09-28 LAB — HOLD TUBE, BLOOD BANK

## 2014-09-28 LAB — COMPREHENSIVE METABOLIC PANEL (CC13)
ALT: 28 U/L (ref 0–55)
AST: 31 U/L (ref 5–34)
Albumin: 4.1 g/dL (ref 3.5–5.0)
Alkaline Phosphatase: 103 U/L (ref 40–150)
Anion Gap: 8 mEq/L (ref 3–11)
BILIRUBIN TOTAL: 0.37 mg/dL (ref 0.20–1.20)
BUN: 19.2 mg/dL (ref 7.0–26.0)
CALCIUM: 9.1 mg/dL (ref 8.4–10.4)
CHLORIDE: 106 meq/L (ref 98–109)
CO2: 29 mEq/L (ref 22–29)
Creatinine: 1.1 mg/dL (ref 0.7–1.3)
Glucose: 97 mg/dl (ref 70–140)
Potassium: 3.7 mEq/L (ref 3.5–5.1)
Sodium: 142 mEq/L (ref 136–145)
Total Protein: 7.6 g/dL (ref 6.4–8.3)

## 2014-09-28 LAB — MAGNESIUM (CC13): MAGNESIUM: 2.7 mg/dL — AB (ref 1.5–2.5)

## 2014-09-28 NOTE — Progress Notes (Signed)
S/w pt in lobby this morning after his lab.  Gave him a copy of his CBC and informed him he does not need any transfusion per Dr. Alvy Bimler.  Pt states he feels well, denies any bleeding and felt like he would not need a transfusion.  Instructed pt to return on Monday 2/8 for lab as scheduled. He verbalized understanding. Faxed CBC results to [facility] at fax [phone].  Scheduled pt for transfusion at [facility] for Monday 2/8 as there is no availability to schedule at our clinic.

## 2014-10-02 ENCOUNTER — Encounter (HOSPITAL_COMMUNITY): Payer: Self-pay

## 2014-10-02 ENCOUNTER — Other Ambulatory Visit: Payer: Medicaid Other

## 2014-10-03 ENCOUNTER — Telehealth: Payer: Self-pay | Admitting: *Deleted

## 2014-10-03 ENCOUNTER — Other Ambulatory Visit: Payer: Self-pay | Admitting: *Deleted

## 2014-10-03 ENCOUNTER — Telehealth: Payer: Self-pay | Admitting: Nurse Practitioner

## 2014-10-03 DIAGNOSIS — C9200 Acute myeloblastic leukemia, not having achieved remission: Secondary | ICD-10-CM

## 2014-10-03 NOTE — Telephone Encounter (Signed)
per pof to sch pt appt-cld & spoke to [facility] & gave pt time & date of appt-Nicole understood

## 2014-10-03 NOTE — Telephone Encounter (Signed)
PT. WAS SEEN IN THIS OFFICE ON 09/28/14. HE WAS SCHEDULED FOR A BLOOD TRANSFUSION ON 10/02/14 AT [facility]. PT. WAS INCARCERATED ON 09/29/14. HE DID NOT GET THE BLOOD TRANSFUSION ON 10/02/14. PT. IS NOT TAKING HIS MEDICATION AS PRESCRIBED WHILE IN HAS BEEN IN [place]. [place] AT 1:45PM TODAY.COULD PT. BE SEEN TODAY AROUND 3:OOPM?

## 2014-10-03 NOTE — Telephone Encounter (Signed)
VERBAL ORDER AND READ BACK TO CINDEE [facility]ACON,APP- DO NOT THINK PT. WILL BE ABLE TO GET TO THIS OFFICE BEFORE IT CLOSES. WILL SEE PT. TOMORROW MORNING. NOTIFIED PT.'S FIANC'EE OF APPOINTMENT FOR TOMORROW. ALSO INSTRUCTED PT. TO GO TO THE EMERGENCY DEPARTMENT IF HE NEEDS TO BE SEEN URGENTLY. SHE VOICES UNDERSTANDING.

## 2014-10-04 ENCOUNTER — Telehealth: Payer: Self-pay | Admitting: *Deleted

## 2014-10-04 ENCOUNTER — Encounter: Payer: Self-pay | Admitting: *Deleted

## 2014-10-04 ENCOUNTER — Encounter: Payer: Self-pay | Admitting: Nurse Practitioner

## 2014-10-04 ENCOUNTER — Ambulatory Visit (HOSPITAL_BASED_OUTPATIENT_CLINIC_OR_DEPARTMENT_OTHER): Payer: Medicaid Other | Admitting: Nurse Practitioner

## 2014-10-04 ENCOUNTER — Other Ambulatory Visit (HOSPITAL_BASED_OUTPATIENT_CLINIC_OR_DEPARTMENT_OTHER): Payer: Self-pay

## 2014-10-04 VITALS — BP 131/72 | HR 64 | Temp 98.0°F | Resp 16 | Wt 179.1 lb

## 2014-10-04 DIAGNOSIS — C9200 Acute myeloblastic leukemia, not having achieved remission: Secondary | ICD-10-CM

## 2014-10-04 DIAGNOSIS — D61810 Antineoplastic chemotherapy induced pancytopenia: Secondary | ICD-10-CM | POA: Diagnosis not present

## 2014-10-04 DIAGNOSIS — Z659 Problem related to unspecified psychosocial circumstances: Secondary | ICD-10-CM | POA: Insufficient documentation

## 2014-10-04 DIAGNOSIS — T451X5A Adverse effect of antineoplastic and immunosuppressive drugs, initial encounter: Secondary | ICD-10-CM

## 2014-10-04 LAB — COMPREHENSIVE METABOLIC PANEL (CC13)
ALBUMIN: 4.4 g/dL (ref 3.5–5.0)
ALK PHOS: 113 U/L (ref 40–150)
ALT: 28 U/L (ref 0–55)
AST: 34 U/L (ref 5–34)
Anion Gap: 8 mEq/L (ref 3–11)
BUN: 17.4 mg/dL (ref 7.0–26.0)
CO2: 27 meq/L (ref 22–29)
CREATININE: 1.1 mg/dL (ref 0.7–1.3)
Calcium: 9.2 mg/dL (ref 8.4–10.4)
Chloride: 104 mEq/L (ref 98–109)
EGFR: >90 mL/min/{1.73_m2} (ref >90)
GLUCOSE: 106 mg/dL (ref 70–140)
Potassium: 4.2 mEq/L (ref 3.5–5.1)
Sodium: 139 mEq/L (ref 136–145)
Total Bilirubin: 0.42 mg/dL (ref 0.20–1.20)
Total Protein: 8 g/dL (ref 6.4–8.3)

## 2014-10-04 LAB — CBC WITH DIFFERENTIAL/PLATELET
BASO%: 0 % (ref 0.0–2.0)
Basophils Absolute: 0 10*3/uL (ref 0.0–0.1)
EOS%: 0 % (ref 0.0–7.0)
Eosinophils Absolute: 0 10*3/uL (ref 0.0–0.5)
HEMATOCRIT: 24.9 % — AB (ref 38.4–49.9)
HEMOGLOBIN: 8.9 g/dL — AB (ref 13.0–17.1)
LYMPH#: 0.5 10*3/uL — AB (ref 0.9–3.3)
LYMPH%: 22.9 % (ref 14.0–49.0)
MCH: 31.2 pg (ref 27.2–33.4)
MCHC: 35.7 g/dL (ref 32.0–36.0)
MCV: 87.4 fL (ref 79.3–98.0)
MONO#: 0.6 10*3/uL (ref 0.1–0.9)
MONO%: 25.7 % — AB (ref 0.0–14.0)
NEUT#: 1.1 10*3/uL — ABNORMAL LOW (ref 1.5–6.5)
NEUT%: 51.4 % (ref 39.0–75.0)
Platelets: 67 10*3/uL — ABNORMAL LOW (ref 140–400)
RBC: 2.85 10*6/uL — AB (ref 4.20–5.82)
RDW: 14.1 % (ref 11.0–14.6)
WBC: 2.1 10*3/uL — AB (ref 4.0–10.3)

## 2014-10-04 LAB — HOLD TUBE, BLOOD BANK

## 2014-10-04 NOTE — Assessment & Plan Note (Signed)
Patient reports that he was incarcerated this past weekend for a probation violation related to a an issue several years ago in [place].  Patient states that he is out on bail at present; but has been instructed per his bail bondsman that the [place] authorities have been instructed to pick patient up and return him to [place] as soon as possible.  Patient is unclear when he will be picked up/transported back to [place]; but feels it this may be imminent.  He states that the transfer to [place] could be within days to 1-2 weeks.  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  After a long discussion with both patient and his fiance-obtained a Education officer, museum consult; and [place] social worker was able to meet with both patient and his fiance.  Social worker will assist patient with official letter from [place] detailing patient's recent care and need for further intensive chemotherapy and possible bone marrow transplant.  Also, will provide patient with both today and previous cancer Center visit notes and lab results as well.  Patient states that he will return to the [facility] later this afternoon to pick up all paperwork; in hopes that this will help/assist patient in postponing transfer to [place] for the time being so he can receive the appropriate care at both [place] and [place] [facility].

## 2014-10-04 NOTE — Progress Notes (Signed)
Faxed labs and office note from [place] to [facility]. Linus Orn at fax [phone].

## 2014-10-04 NOTE — Progress Notes (Signed)
SYMPTOM MANAGEMENT CLINIC   HPI: Derrick Callahan 44 y.o. 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  Currently undergoing HiDAC chemotherapy per [facility].  Patient presents to the [facility] today for labs and follow-up.  Patient actually missed his appointment this past Monday, 10/02/14.  Patient states that he was incarcerated for a probation violation from a previous incident several years ago in [place].  Patient states that he just got out on bail yesterday afternoon.  Patient states that he's been doing fairly well within the past week or so.  He continues to complain of some very mild, chronic fatigue; but denies any worsening issues with fatigue or shortness of breath with exertion.  He denies any recent fevers or chills.  Patient states that he has been informed per his bail bondsman that the [place] authorities will be picking him up to return him to [place] shortly.  He is unclear if this transfer to [place] will be a matter of days or weeks.  Patient plans to meet with the court-appointed attorney in the morning in hopes of putting his legal issues on hold until after his bone marrow transplant.  Patient states there was concern over this past weekend while he was incarcerated regarding his lab draws, Port-A-Cath access and flushing, and higher risk for infection and bleeding.   HPI  ROS  Past Medical History  Diagnosis Date  . AML (acute myelogenous leukemia) 07/26/2014  . Hypertension     Past Surgical History  Procedure Laterality Date  . Portacath placement      has AML (acute myelogenous leukemia); Thrombocytopenia due to drugs; Leukopenia due to antineoplastic chemotherapy; Fungal pneumonia; Anemia in neoplastic disease; Elevated liver enzymes; Tobacco dependency; Pancytopenia due to antineoplastic chemotherapy; and Problem related to psychosocial circumstances on his problem list.    has No Known Allergies.    Medication  List       This list is accurate as of: 10/04/14 11:14 AM.  Always use your most recent med list.               acyclovir 400 MG tablet  Commonly known as:  ZOVIRAX  Take 400 mg by mouth 2 (two) times daily.     amLODipine 5 MG tablet  Commonly known as:  NORVASC  Take 5 mg by mouth.     hydrochlorothiazide 25 MG tablet  Commonly known as:  HYDRODIURIL  Take 25 mg by mouth daily.     levofloxacin 500 MG tablet  Commonly known as:  LEVAQUIN  Take 500 mg by mouth daily.     lidocaine-prilocaine cream  Commonly known as:  EMLA  Apply 1 application topically as needed. Apply to porta cath site one hour prior to needle stick.     prochlorperazine 10 MG tablet  Commonly known as:  COMPAZINE  Take 10 mg by mouth every 6 (six) hours as needed for nausea or vomiting.     voriconazole 200 MG tablet  Commonly known as:  VFEND  Take 200 mg by mouth 2 (two) times daily.         PHYSICAL EXAMINATION  Blood pressure 131/72, pulse 64, temperature 98 F (36.7 C), temperature source Oral, resp. rate 16, weight 179 lb 1.6 oz (81.239 kg).  Physical Exam  Constitutional: He is oriented to person, place, and time. Vital signs are normal. He appears unhealthy.  HENT:  Head: Normocephalic and atraumatic.  Mouth/Throat: Oropharynx is clear and moist.  Eyes: Conjunctivae and  EOM are normal. Pupils are equal, round, and reactive to light. Right eye exhibits no discharge. Left eye exhibits no discharge. No scleral icterus.  Neck: Normal range of motion. Neck supple. No JVD present. No tracheal deviation present. No thyromegaly present.  Cardiovascular: Normal rate, regular rhythm, normal heart sounds and intact distal pulses.   Pulmonary/Chest: Effort normal and breath sounds normal. No respiratory distress. He has no wheezes. He has no rales. He exhibits no tenderness.  Abdominal: Soft. Bowel sounds are normal. He exhibits no distension and no mass. There is no tenderness. There is no  rebound and no guarding.  Musculoskeletal: Normal range of motion. He exhibits no edema or tenderness.  Lymphadenopathy:    He has no cervical adenopathy.  Neurological: He is alert and oriented to person, place, and time. Gait normal.  Skin: Skin is warm and dry. No rash noted. No erythema.  Psychiatric: Affect normal.  Nursing note and vitals reviewed.   LABORATORY DATA:. Appointment on 10/04/2014  Component Date Value Ref Range Status  . WBC 10/04/2014 2.1* 4.0 - 10.3 10e3/uL Final  . NEUT# 10/04/2014 1.1* 1.5 - 6.5 10e3/uL Final  . HGB 10/04/2014 8.9* 13.0 - 17.1 g/dL Final  . HCT 10/04/2014 24.9* 38.4 - 49.9 % Final  . Platelets 10/04/2014 67* 140 - 400 10e3/uL Final  . MCV 10/04/2014 87.4  79.3 - 98.0 fL Final  . MCH 10/04/2014 31.2  27.2 - 33.4 pg Final  . MCHC 10/04/2014 35.7  32.0 - 36.0 g/dL Final  . RBC 10/04/2014 2.85* 4.20 - 5.82 10e6/uL Final  . RDW 10/04/2014 14.1  11.0 - 14.6 % Final  . lymph# 10/04/2014 0.5* 0.9 - 3.3 10e3/uL Final  . MONO# 10/04/2014 0.6  0.1 - 0.9 10e3/uL Final  . Eosinophils Absolute 10/04/2014 0.0  0.0 - 0.5 10e3/uL Final  . Basophils Absolute 10/04/2014 0.0  0.0 - 0.1 10e3/uL Final  . NEUT% 10/04/2014 51.4  39.0 - 75.0 % Final  . LYMPH% 10/04/2014 22.9  14.0 - 49.0 % Final  . MONO% 10/04/2014 25.7* 0.0 - 14.0 % Final  . EOS% 10/04/2014 0.0  0.0 - 7.0 % Final  . BASO% 10/04/2014 0.0  0.0 - 2.0 % Final  . Sodium 10/04/2014 139  136 - 145 mEq/L Final  . Potassium 10/04/2014 4.2  3.5 - 5.1 mEq/L Final  . Chloride 10/04/2014 104  98 - 109 mEq/L Final  . CO2 10/04/2014 27  22 - 29 mEq/L Final  . Glucose 10/04/2014 106  70 - 140 mg/dl Final  . BUN 10/04/2014 17.4  7.0 - 26.0 mg/dL Final  . Creatinine 10/04/2014 1.1  0.7 - 1.3 mg/dL Final  . Total Bilirubin 10/04/2014 0.42  0.20 - 1.20 mg/dL Final  . Alkaline Phosphatase 10/04/2014 113  40 - 150 U/L Final  . AST 10/04/2014 34  5 - 34 U/L Final  . ALT 10/04/2014 28  0 - 55 U/L Final  . Total  Protein 10/04/2014 8.0  6.4 - 8.3 g/dL Final  . Albumin 10/04/2014 4.4  3.5 - 5.0 g/dL Final  . Calcium 10/04/2014 9.2  8.4 - 10.4 mg/dL Final  . Anion Gap 10/04/2014 8  3 - 11 mEq/L Final  . EGFR 10/04/2014 >90  >90 ml/min/1.73 m2 Final   eGFR is calculated using the CKD-EPI Creatinine Equation (2009)  . Hold Tube, Blood Bank 10/04/2014 Blood Bank Order Cancelled   Final     RADIOGRAPHIC STUDIES: No results found.  ASSESSMENT/PLAN:    AML (  acute myelogenous leukemia) Patient is status post 2 cycles of HiDAC chemotherapy per [facility]; completing cycle 2 on 09/05/2014.  Patient is scheduled to initiate cycle 3 of the same chemotherapy regimen on Thursday, 10/12/2014.  Following the completion of cycle 3 of his inpatient chemotherapy-patient will then undergo evaluation for possible bone marrow transplant per [facility].  Patient was scheduled to return for labs tomorrow; but will reschedule patient for labs and possible blood product transfusion for this coming Monday, 10/09/2014.   Pancytopenia due to antineoplastic chemotherapy Patient continues with pancytopenia secondary to both leukemia diagnosis and recent chemotherapy.  Patient continues neutropenic with an ANC of 1.1.  Hemoglobin remains fairly stable at 8.9.  Platelet count has improved from 23 up to 67.  Patient and his fiance were once again reminded of all neutropenia guidelines.  Patient denies any worsening issues with either easy fatigue or shortness of breath with exertion.  Patient also denies any worsening issues with either easy bruising or bleeding.  Patient has plans to return this coming Monday, 10/09/2014 for a repeat lab draw; and possible blood product transfusion. Parameters for transfusion include: Transfuse irradiated packed red blood cells if hemoglobin less than 8; and transfuse irradiated platelets for platelet count less than 15,000.  Per [facility] hospital-the preference is no Neulasta or Neupogen/Granix for growth factor support.    Problem related to psychosocial circumstances Patient reports that he was incarcerated this past weekend for a probation violation related to a an issue several years ago in [place].  Patient states that he is out on bail at present; but has been instructed per his bail bondsman that the [place] authorities have been instructed to pick patient up and return him to [place] as soon as possible.  Patient is unclear when he will be picked up/transported back to [place]; but feels it this may be imminent.  He states that the transfer to [place] could be within days to 1-2 weeks.  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  After a long discussion with both patient and his fiance-obtained a Education officer, museum consult; and [place] social worker was able to meet with both patient and his fiance.  Social worker will assist patient with official letter from [place] detailing patient's recent care and need for further intensive chemotherapy and possible bone marrow transplant.  Also, will provide patient with both today and previous cancer Center visit notes and lab results as well.  Patient states that he will return to the [place] later this afternoon to pick up all paperwork; in hopes that this will help/assist patient in postponing transfer to [place] for the time being so he can receive the appropriate care at both [place] and [facility].   Patient stated understanding of all instructions; and was in agreement with this plan of care. The patient knows to call the clinic with any problems, questions or concerns.   Review/collaboration with Dr. Alvy Bimler regarding  all aspects of patient's visit today.   Total time spent with patient was 40 minutes;  with greater than 75 percent of that time spent in face to face counseling regarding patient's symptoms,  and coordination of care and follow up.  Disclaimer: This note was dictated with voice  recognition software. Similar sounding words can inadvertently be transcribed and may not be corrected upon review.   Drue Second, NP 10/04/2014

## 2014-10-04 NOTE — Assessment & Plan Note (Signed)
Patient is status post 2 cycles of HiDAC chemotherapy per [facility]; completing cycle 2 on 09/05/2014.  Patient is scheduled to initiate cycle 3 of the same chemotherapy regimen on Thursday, 10/12/2014.  Following the completion of cycle 3 of his inpatient chemotherapy-patient will then undergo evaluation for possible bone marrow transplant per [facility].  Patient was scheduled to return for labs tomorrow; but will reschedule patient for labs and possible blood product transfusion for this coming Monday, 10/09/2014.

## 2014-10-04 NOTE — Telephone Encounter (Signed)
Per staff message and POF I have scheduled appts. Advised scheduler of appts. JMW  

## 2014-10-04 NOTE — Assessment & Plan Note (Signed)
Patient continues with pancytopenia secondary to both leukemia diagnosis and recent chemotherapy.  Patient continues neutropenic with an ANC of 1.1.  Hemoglobin remains fairly stable at 8.9.  Platelet count has improved from 23 up to 67.  Patient and his fiance were once again reminded of all neutropenia guidelines.  Patient denies any worsening issues with either easy fatigue or shortness of breath with exertion.  Patient also denies any worsening issues with either easy bruising or bleeding.  Patient has plans to return this coming Monday, 10/09/2014 for a repeat lab draw; and possible blood product transfusion. Parameters for transfusion include: Transfuse irradiated packed red blood cells if hemoglobin less than 8; and transfuse irradiated platelets for platelet count less than 15,000.  Per [facility] hospital-the preference is no Neulasta or Neupogen/Granix for growth factor support.

## 2014-10-05 ENCOUNTER — Other Ambulatory Visit: Payer: Medicaid Other

## 2014-10-05 NOTE — Progress Notes (Signed)
[place] Work  Clinical Social Work was referred by Selena Lesser, NP, for urgent psychosocial needs.  Clinical Social Worker met with patient and patient's friend in medical oncology exam room to offer support and assess for needs.  Mr. Corrales shared he is currently dealing with legal issues, was recently incarcerated, and is fearful he will be summoned to serve time in [place].  Patient and NP are very concerned this could hinder patient's treatment regimen/outcomes.  CSW/NP provided patient with letter explaining current medical condition to provide to judge/court appointed lawyer tomorrow.  CSW left letter/medical information at front desk for patient to pick up.   Polo Riley, MSW, LCSW, OSW-C Clinical Social Worker [facility] [phone]

## 2014-10-09 ENCOUNTER — Telehealth: Payer: Self-pay | Admitting: *Deleted

## 2014-10-09 ENCOUNTER — Other Ambulatory Visit: Payer: Self-pay

## 2014-10-09 NOTE — Telephone Encounter (Signed)
S/w pt's fiance.  She says pt could not make his appts today d/t weather.  Informed her he needs to have his labs checked again before he goes back to [facility] later this week on Thursday.  Suggested we r/s his lab and transfusion appt to tomorrow.   She agreed and will wait to hear from scheduler.

## 2014-10-09 NOTE — Telephone Encounter (Signed)
Per staff message and POF I have scheduled appts. Advised scheduler of appts and to move labs. JMW  

## 2014-10-10 ENCOUNTER — Other Ambulatory Visit: Payer: Self-pay

## 2014-10-10 ENCOUNTER — Telehealth: Payer: Self-pay | Admitting: *Deleted

## 2014-10-10 NOTE — Telephone Encounter (Signed)
Pt did not show up for labs yesterday due to weather.  He was rescheduled to this morning.  Pt did not show up for his lab this morning.  Called pt at home and he says his counts were good last week and he thinks he does not need to have labs checked again this week.  He goes back to [facility] day after tomorrow on Thursday 10/12/14.  He says he feels good and he wants to wait to have labs done at [facility].   Informed pt our orders from [facility] were to check his labs here yesterday.  He understands, but states he will be ok.  Instructed him to call us if he needs anything.  We will cancel his appts here for today and wait for further orders from [place] verbalized understanding.   This note faxed to [facility] at Fax [phone],

## 2014-10-19 ENCOUNTER — Other Ambulatory Visit: Payer: Medicaid Other

## 2014-10-19 ENCOUNTER — Ambulatory Visit: Payer: Medicaid Other | Admitting: Hematology and Oncology

## 2014-10-19 ENCOUNTER — Ambulatory Visit: Payer: Medicaid Other

## 2014-10-23 ENCOUNTER — Other Ambulatory Visit: Payer: Self-pay | Admitting: Hematology and Oncology

## 2014-10-24 ENCOUNTER — Telehealth: Payer: Self-pay | Admitting: Hematology and Oncology

## 2014-10-24 ENCOUNTER — Ambulatory Visit (HOSPITAL_COMMUNITY)
Admission: RE | Admit: 2014-10-24 | Discharge: 2014-10-24 | Disposition: A | Discharge status: Home / Self Care | Payer: Medicaid Other | Source: Ambulatory Visit | Attending: Hematology and Oncology | Admitting: Hematology and Oncology

## 2014-10-24 ENCOUNTER — Telehealth: Payer: Self-pay | Admitting: *Deleted

## 2014-10-24 DIAGNOSIS — C9200 Acute myeloblastic leukemia, not having achieved remission: Secondary | ICD-10-CM | POA: Insufficient documentation

## 2014-10-24 NOTE — Telephone Encounter (Signed)
Per staff message and POF I have scheduled appts. Advised scheduler of appts and no available on 3/8. JMW

## 2014-10-24 NOTE — Telephone Encounter (Signed)
no vm ..Marland KitchenMarland KitchenMarland Kitchen

## 2014-10-25 ENCOUNTER — Telehealth: Payer: Self-pay | Admitting: *Deleted

## 2014-10-25 NOTE — Telephone Encounter (Signed)
Informed Dorise Hiss, RN at [facility] ,[phone], of pt's appts next week and faxed over pt's calendar.

## 2014-10-31 ENCOUNTER — Encounter: Payer: Self-pay | Admitting: *Deleted

## 2014-10-31 ENCOUNTER — Ambulatory Visit (HOSPITAL_BASED_OUTPATIENT_CLINIC_OR_DEPARTMENT_OTHER): Payer: Medicaid Other | Admitting: Hematology and Oncology

## 2014-10-31 ENCOUNTER — Other Ambulatory Visit: Payer: Self-pay | Admitting: Hematology and Oncology

## 2014-10-31 ENCOUNTER — Encounter: Payer: Self-pay | Admitting: Hematology and Oncology

## 2014-10-31 ENCOUNTER — Other Ambulatory Visit (HOSPITAL_BASED_OUTPATIENT_CLINIC_OR_DEPARTMENT_OTHER): Payer: Medicaid Other

## 2014-10-31 VITALS — BP 141/81 | HR 86 | Temp 97.8°F | Resp 20 | Ht 71.0 in | Wt 188.2 lb

## 2014-10-31 DIAGNOSIS — C9200 Acute myeloblastic leukemia, not having achieved remission: Secondary | ICD-10-CM

## 2014-10-31 DIAGNOSIS — T50905A Adverse effect of unspecified drugs, medicaments and biological substances, initial encounter: Secondary | ICD-10-CM

## 2014-10-31 DIAGNOSIS — R748 Abnormal levels of other serum enzymes: Secondary | ICD-10-CM

## 2014-10-31 DIAGNOSIS — D701 Agranulocytosis secondary to cancer chemotherapy: Secondary | ICD-10-CM

## 2014-10-31 DIAGNOSIS — B49 Unspecified mycosis: Secondary | ICD-10-CM

## 2014-10-31 DIAGNOSIS — F17200 Nicotine dependence, unspecified, uncomplicated: Secondary | ICD-10-CM

## 2014-10-31 DIAGNOSIS — T451X5A Adverse effect of antineoplastic and immunosuppressive drugs, initial encounter: Secondary | ICD-10-CM

## 2014-10-31 DIAGNOSIS — D630 Anemia in neoplastic disease: Secondary | ICD-10-CM

## 2014-10-31 DIAGNOSIS — D6959 Other secondary thrombocytopenia: Secondary | ICD-10-CM

## 2014-10-31 DIAGNOSIS — J17 Pneumonia in diseases classified elsewhere: Secondary | ICD-10-CM

## 2014-10-31 LAB — COMPREHENSIVE METABOLIC PANEL (CC13)
ALK PHOS: 84 U/L (ref 40–150)
ALT: 33 U/L (ref 0–55)
AST: 29 U/L (ref 5–34)
Albumin: 4.1 g/dL (ref 3.5–5.0)
Anion Gap: 9 mEq/L (ref 3–11)
BUN: 20.2 mg/dL (ref 7.0–26.0)
CO2: 26 meq/L (ref 22–29)
CREATININE: 1 mg/dL (ref 0.7–1.3)
Calcium: 9.3 mg/dL (ref 8.4–10.4)
Chloride: 104 mEq/L (ref 98–109)
Glucose: 107 mg/dl (ref 70–140)
Potassium: 4.1 mEq/L (ref 3.5–5.1)
SODIUM: 140 meq/L (ref 136–145)
TOTAL PROTEIN: 7.4 g/dL (ref 6.4–8.3)
Total Bilirubin: 0.57 mg/dL (ref 0.20–1.20)

## 2014-10-31 LAB — CBC WITH DIFFERENTIAL/PLATELET
BASO%: 0.5 % (ref 0.0–2.0)
BASOS ABS: 0 10*3/uL (ref 0.0–0.1)
EOS ABS: 0 10*3/uL (ref 0.0–0.5)
EOS%: 0.4 % (ref 0.0–7.0)
HCT: 33.1 % — ABNORMAL LOW (ref 38.4–49.9)
HEMOGLOBIN: 11 g/dL — AB (ref 13.0–17.1)
LYMPH%: 13 % — ABNORMAL LOW (ref 14.0–49.0)
MCH: 32.2 pg (ref 27.2–33.4)
MCHC: 33.1 g/dL (ref 32.0–36.0)
MCV: 97.3 fL (ref 79.3–98.0)
MONO#: 0 10*3/uL — ABNORMAL LOW (ref 0.1–0.9)
MONO%: 1.3 % (ref 0.0–14.0)
NEUT%: 84.8 % — AB (ref 39.0–75.0)
NEUTROS ABS: 0.7 10*3/uL — AB (ref 1.5–6.5)
Platelets: 52 10*3/uL — ABNORMAL LOW (ref 140–400)
RBC: 3.4 10*6/uL — ABNORMAL LOW (ref 4.20–5.82)
RDW: 17.8 % — AB (ref 11.0–14.6)
WBC: 0.9 10*3/uL — CL (ref 4.0–10.3)
lymph#: 0.1 10*3/uL — ABNORMAL LOW (ref 0.9–3.3)

## 2014-10-31 LAB — HOLD TUBE, BLOOD BANK

## 2014-10-31 LAB — MAGNESIUM (CC13): Magnesium: 2.2 mg/dl (ref 1.5–2.5)

## 2014-10-31 NOTE — Progress Notes (Signed)
[place] OFFICE PROGRESS NOTE  Patient Care Team: Art Buff, MD as Consulting Physician (Hematology and Oncology)  SUMMARY OF ONCOLOGIC HISTORY: Oncology History    Diagnosis:  AML diagnosis and induction therapy given at outside hospital at the time of diagnosis:  CDP (05/25/14) WBC 128, Blasts 94%, ANC 0, Hb 5, Plt 9  BM Bx (05/26/14) AML with abnl myeloid phenotype   (pos) CD33, CD 45 (dim), CD38 (dim), MPO  (neg) CD34, CD56, CD10, HLA DR, TdT  nl karyotype  FLT-3 ITD (+), NPM1 (+), CEBPa (-)  Pulmonary Aspirgillosis  CT chest (06/10/14) with 1.5cm LUL lesion concerning for fungal infection  Treatment:  s/p leukopheresis; 05/25/14  s/p 7+3 at [facility]; 05/26/14  HPI:  AML diagnosed at [facility]  CDP WBC 128, Blasts 94%, ANC 0, Hb 5, Plt 9; 05/25/14  BM Bx AML with abnl myeloid phenotype; 05/26/14   leukopheresis; 05/25/14  7+3; 05/26/14  BM Bx with hypocellular marrow (5%) with no residual disease by morphology; 06/09/14  CT chest (06/10/14) with 1.5cm LUL lesion concerning for fungal infection  BM Bx (06/20/14) with hypercellular (90%) with no sign of malignancy by morphology  Established care at [facility]; 07/06/14  Cycle #1 consolidation with HiDAC; 07/13/14      AML (acute myelogenous leukemia)   05/26/2014 Cancer Diagnosis he was diagnosed with acute myelogenous leukemia   05/26/2014 - 06/02/2014 Chemotherapy he received induction chemotherapy with 7+3. he also received leukapheresis   06/09/2014 Bone Marrow Biopsy repeat bone marrow biopsy showed hypocellular marrow with 5%   06/10/2014 Imaging CT scan was abnormal concerning for fungal infection   06/20/2014 Bone Marrow Biopsy repeat bone marrow biopsy show no evidence of cancer   07/13/2014 - 07/17/2014 Chemotherapy he received cycle 1 consolidation therapy with hiDAC   08/31/2014 - 09/05/2014 Chemotherapy he received cycle 2 of hiDAC   09/02/2014 Imaging repeat CT scan  show resolution of lung infiltrate   10/23/2014 - 10/28/2014 Hospital Admission The patient was admitted to the hospital for cycle 3 of consolidation chemotherapy    INTERVAL HISTORY: Please see below for problem oriented charting. He is seen as part of supportive care visit. He feels well after treatment. Complained of mild fatigue. The patient denies any recent signs or symptoms of bleeding such as spontaneous epistaxis, hematuria or hematochezia.   REVIEW OF SYSTEMS:   Constitutional: Denies fevers, chills or abnormal weight loss Eyes: Denies blurriness of vision Ears, nose, mouth, throat, and face: Denies mucositis or sore throat Respiratory: Denies cough, dyspnea or wheezes Cardiovascular: Denies palpitation, chest discomfort or lower extremity swelling Gastrointestinal:  Denies nausea, heartburn or change in bowel habits Skin: Denies abnormal skin rashes Lymphatics: Denies new lymphadenopathy or easy bruising Neurological:Denies numbness, tingling or new weaknesses Behavioral/Psych: Mood is stable, no new changes  All other systems were reviewed with the patient and are negative.  I have reviewed the past medical history, past surgical history, social history and family history with the patient and they are unchanged from previous note.  ALLERGIES:  has No Known Allergies.  MEDICATIONS:  Current Outpatient Prescriptions  Medication Sig Dispense Refill  . amLODipine (NORVASC) 5 MG tablet Take 5 mg by mouth.    . hydrochlorothiazide (HYDRODIURIL) 25 MG tablet Take 25 mg by mouth daily.  5  . lidocaine-prilocaine (EMLA) cream Apply 1 application topically as needed. Apply to porta cath site one hour prior to needle stick. 30 g 6  . voriconazole (VFEND) 200 MG tablet Take  200 mg by mouth 2 (two) times daily.    Marland Kitchen acyclovir (ZOVIRAX) 400 MG tablet Take 400 mg by mouth 2 (two) times daily. Start for neutropenia    . levofloxacin (LEVAQUIN) 500 MG tablet Take 500 mg by mouth daily.  Start for neutropenia    . prochlorperazine (COMPAZINE) 10 MG tablet Take 10 mg by mouth every 6 (six) hours as needed for nausea or vomiting.     No current facility-administered medications for this visit.    PHYSICAL EXAMINATION: ECOG PERFORMANCE STATUS: 0 - Asymptomatic  Filed Vitals:   10/31/14 1025  BP: 141/81  Pulse: 86  Temp: 97.8 F (36.6 C)  Resp: 20   Filed Weights   10/31/14 1025  Weight: 188 lb 3.2 oz (85.367 kg)    GENERAL:alert, no distress and comfortable SKIN: skin color, texture, turgor are normal, no rashes or significant lesions EYES: normal, Conjunctiva are pink and non-injected, sclera clear OROPHARYNX:no exudate, no erythema and lips, buccal mucosa, and tongue normal  NECK: supple, thyroid normal size, non-tender, without nodularity LYMPH:  no palpable lymphadenopathy in the cervical, axillary or inguinal LUNGS: clear to auscultation and percussion with normal breathing effort HEART: regular rate & rhythm and no murmurs and no lower extremity edema ABDOMEN:abdomen soft, non-tender and normal bowel sounds Musculoskeletal:no cyanosis of digits and no clubbing  NEURO: alert & oriented x 3 with fluent speech, no focal motor/sensory deficits  LABORATORY DATA:  I have reviewed the data as listed    Component Value Date/Time   NA 139 10/04/2014 0854   K 4.2 10/04/2014 0854   CO2 27 10/04/2014 0854   GLUCOSE 106 10/04/2014 0854   BUN 17.4 10/04/2014 0854   CREATININE 1.1 10/04/2014 0854   CALCIUM 9.2 10/04/2014 0854   PROT 8.0 10/04/2014 0854   ALBUMIN 4.4 10/04/2014 0854   AST 34 10/04/2014 0854   ALT 28 10/04/2014 0854   ALKPHOS 113 10/04/2014 0854   BILITOT 0.42 10/04/2014 0854    No results found for: SPEP, UPEP  Lab Results  Component Value Date   WBC 0.9* 10/31/2014   NEUTROABS 0.7* 10/31/2014   HGB 11.0* 10/31/2014   HCT 33.1* 10/31/2014   MCV 97.3 10/31/2014   PLT 52* 10/31/2014      Chemistry      Component Value Date/Time    NA 139 10/04/2014 0854   K 4.2 10/04/2014 0854   CO2 27 10/04/2014 0854   BUN 17.4 10/04/2014 0854   CREATININE 1.1 10/04/2014 0854      Component Value Date/Time   CALCIUM 9.2 10/04/2014 0854   ALKPHOS 113 10/04/2014 0854   AST 34 10/04/2014 0854   ALT 28 10/04/2014 0854   BILITOT 0.42 10/04/2014 0854      ASSESSMENT & PLAN:  AML (acute myelogenous leukemia) Patient is status post 3 cycles of HiDAC chemotherapy per [facility]. He will return to [facility] on 11/30/2014 for repeat bone marrow biopsy. In the meantime, we will continue supportive care visit with transfusion as needed. He will continue taking antimicrobial prophylaxis.   Anemia in neoplastic disease This is likely due to recent treatment. The patient denies recent history of bleeding such as epistaxis, hematuria or hematochezia. He is asymptomatic from the anemia. I will observe for now.   We'll establish transfusion threshold to give him 1 unit of blood whenever his hemoglobin is less than 8 g. He needs irradiated blood products.   Leukopenia due to antineoplastic chemotherapy Per orders from  [facility], we will not administer Neulasta. He will continue taking antimicrobial prophylaxis    Thrombocytopenia due to drugs This is likely due to recent treatment. The patient denies recent history of bleeding such as epistaxis, hematuria or hematochezia. He is asymptomatic from the low platelet count. The patient will need irradiated blood products. I recommend transfusion threshold of 15,000 for him      Fungal pneumonia According to records from [facility], his recent CT scan showed resolution of the abnormal infiltrate. He will continue on medication with antifungal agent   Elevated liver enzymes This is likely related to recent treatment and his antimicrobial agents. I will monitor this closely.    Tobacco dependency I spent some time counseling the patient the importance  of tobacco cessation. he is currently attempting to quit on his own     No orders of the defined types were placed in this encounter.   All questions were answered. The patient knows to call the clinic with any problems, questions or concerns. No barriers to learning was detected. I spent 25 minutes counseling the patient face to face. The total time spent in the appointment was 30 minutes and more than 50% was on counseling and review of test results     [facility], [place], MD 10/31/2014 10:39 AM

## 2014-10-31 NOTE — Assessment & Plan Note (Signed)
This is likely due to recent treatment. The patient denies recent history of bleeding such as epistaxis, hematuria or hematochezia. He is asymptomatic from the low platelet count. The patient will need irradiated blood products. I recommend transfusion threshold of 15,000 for him

## 2014-10-31 NOTE — Assessment & Plan Note (Signed)
This is likely related to recent treatment and his antimicrobial agents. I will monitor this closely.

## 2014-10-31 NOTE — Assessment & Plan Note (Signed)
Patient is status post 3 cycles of HiDAC chemotherapy per [facility]. He will return to [facility] on 11/30/2014 for repeat bone marrow biopsy. In the meantime, we will continue supportive care visit with transfusion as needed. He will continue taking antimicrobial prophylaxis.

## 2014-10-31 NOTE — Assessment & Plan Note (Signed)
According to records from [facility], his recent CT scan showed resolution of the abnormal infiltrate. He will continue on medication with antifungal agent

## 2014-10-31 NOTE — Assessment & Plan Note (Signed)
This is likely due to recent treatment. The patient denies recent history of bleeding such as epistaxis, hematuria or hematochezia. He is asymptomatic from the anemia. I will observe for now.   We'll establish transfusion threshold to give him 1 unit of blood whenever his hemoglobin is less than 8 g. He needs irradiated blood products.

## 2014-10-31 NOTE — Assessment & Plan Note (Signed)
Per orders from [facility], we will not administer Neulasta. He will continue taking antimicrobial prophylaxis

## 2014-10-31 NOTE — Progress Notes (Signed)
Lab results faxed to Dr. Linus Orn at [facility] at fax [phone].

## 2014-10-31 NOTE — Assessment & Plan Note (Signed)
I spent some time counseling the patient the importance of tobacco cessation. he is currently attempting to quit on his own 

## 2014-11-03 ENCOUNTER — Ambulatory Visit (HOSPITAL_BASED_OUTPATIENT_CLINIC_OR_DEPARTMENT_OTHER): Payer: Medicaid Other

## 2014-11-03 ENCOUNTER — Other Ambulatory Visit: Payer: Self-pay | Admitting: *Deleted

## 2014-11-03 ENCOUNTER — Other Ambulatory Visit (HOSPITAL_BASED_OUTPATIENT_CLINIC_OR_DEPARTMENT_OTHER): Payer: Medicaid Other

## 2014-11-03 VITALS — BP 114/76 | HR 77 | Temp 97.8°F | Resp 16

## 2014-11-03 DIAGNOSIS — C9200 Acute myeloblastic leukemia, not having achieved remission: Secondary | ICD-10-CM

## 2014-11-03 DIAGNOSIS — Z95828 Presence of other vascular implants and grafts: Secondary | ICD-10-CM

## 2014-11-03 LAB — CBC WITH DIFFERENTIAL/PLATELET
BASO%: 0 % (ref 0.0–2.0)
BASOS ABS: 0 10*3/uL (ref 0.0–0.1)
EOS%: 0 % (ref 0.0–7.0)
Eosinophils Absolute: 0 10*3/uL (ref 0.0–0.5)
HEMATOCRIT: 27.5 % — AB (ref 38.4–49.9)
HEMOGLOBIN: 9.7 g/dL — AB (ref 13.0–17.1)
LYMPH%: 75.5 % — ABNORMAL HIGH (ref 14.0–49.0)
MCH: 31.9 pg (ref 27.2–33.4)
MCHC: 35.3 g/dL (ref 32.0–36.0)
MCV: 90.5 fL (ref 79.3–98.0)
MONO#: 0 10*3/uL — AB (ref 0.1–0.9)
MONO%: 4.1 % (ref 0.0–14.0)
NEUT%: 20.4 % — AB (ref 39.0–75.0)
NEUTROS ABS: 0.1 10*3/uL — AB (ref 1.5–6.5)
PLATELETS: 11 10*3/uL — AB (ref 140–400)
RBC: 3.04 10*6/uL — ABNORMAL LOW (ref 4.20–5.82)
RDW: 14.9 % — ABNORMAL HIGH (ref 11.0–14.6)
WBC: 0.5 10*3/uL — CL (ref 4.0–10.3)
lymph#: 0.4 10*3/uL — ABNORMAL LOW (ref 0.9–3.3)
nRBC: 0 % (ref 0–0)

## 2014-11-03 LAB — HOLD TUBE, BLOOD BANK

## 2014-11-03 MED ORDER — SODIUM CHLORIDE 0.9 % IJ SOLN
10.0000 mL | INTRAMUSCULAR | Status: DC | PRN
  Administered 2014-11-03: 10 mL via INTRAVENOUS
  Filled 2014-11-03: qty 10

## 2014-11-03 MED ORDER — DIPHENHYDRAMINE HCL 25 MG PO CAPS
ORAL_CAPSULE | ORAL | Status: AC
  Filled 2014-11-03: qty 1

## 2014-11-03 MED ORDER — DIPHENHYDRAMINE HCL 25 MG PO CAPS
25.0000 mg | ORAL_CAPSULE | Freq: Once | ORAL | Status: AC
Start: 2014-11-03 — End: 2014-11-03
  Administered 2014-11-03: 25 mg via ORAL

## 2014-11-03 MED ORDER — ACETAMINOPHEN 325 MG PO TABS
ORAL_TABLET | ORAL | Status: AC
  Filled 2014-11-03: qty 2

## 2014-11-03 MED ORDER — HEPARIN SOD (PORK) LOCK FLUSH 100 UNIT/ML IV SOLN
500.0000 [IU] | Freq: Once | INTRAVENOUS | Status: AC
  Administered 2014-11-03: 500 [IU] via INTRAVENOUS
  Filled 2014-11-03: qty 5

## 2014-11-03 MED ORDER — ACETAMINOPHEN 325 MG PO TABS
650.0000 mg | ORAL_TABLET | Freq: Once | ORAL | Status: AC
  Administered 2014-11-03: 650 mg via ORAL

## 2014-11-03 NOTE — Progress Notes (Signed)
S/w pt in lobby.  Gave him copy of CBC and informed him of order for one unit of platelets today.  He verbalized understanding.   Labs faxed to [facility] at fax [phone].

## 2014-11-03 NOTE — Progress Notes (Signed)
Patient states he does not want to wait for his platelets.  Let him know that platelets are ready now.  He states he still does not want to stay.  His ride is here and he has things to do.  Patient signed blood refusal for today.  Dr. Alvy Bimler notified. Offered patient to come back this afternoon or tomorrow am.  He states he will call within the hour and let us know.  He will call the Triage Nurse and inform her of when he will return for his platelets.

## 2014-11-06 LAB — PREPARE PLATELET PHERESIS
UNIT DIVISION: 0
Unit division: 0

## 2014-11-07 ENCOUNTER — Ambulatory Visit (HOSPITAL_BASED_OUTPATIENT_CLINIC_OR_DEPARTMENT_OTHER): Payer: Medicaid Other

## 2014-11-07 ENCOUNTER — Other Ambulatory Visit: Payer: Self-pay | Admitting: *Deleted

## 2014-11-07 ENCOUNTER — Telehealth: Payer: Self-pay | Admitting: *Deleted

## 2014-11-07 ENCOUNTER — Other Ambulatory Visit: Payer: Self-pay | Admitting: Hematology and Oncology

## 2014-11-07 ENCOUNTER — Other Ambulatory Visit (HOSPITAL_BASED_OUTPATIENT_CLINIC_OR_DEPARTMENT_OTHER): Payer: Medicaid Other

## 2014-11-07 VITALS — BP 111/68 | HR 60 | Temp 98.5°F | Resp 18

## 2014-11-07 DIAGNOSIS — C9200 Acute myeloblastic leukemia, not having achieved remission: Secondary | ICD-10-CM

## 2014-11-07 DIAGNOSIS — D630 Anemia in neoplastic disease: Secondary | ICD-10-CM

## 2014-11-07 LAB — CBC WITH DIFFERENTIAL/PLATELET
BASO%: 0 % (ref 0.0–2.0)
Basophils Absolute: 0 10*3/uL (ref 0.0–0.1)
EOS ABS: 0 10*3/uL (ref 0.0–0.5)
EOS%: 0 % (ref 0.0–7.0)
HCT: 25.7 % — ABNORMAL LOW (ref 38.4–49.9)
HEMOGLOBIN: 9.2 g/dL — AB (ref 13.0–17.1)
LYMPH#: 0.3 10*3/uL — AB (ref 0.9–3.3)
LYMPH%: 87.5 % — AB (ref 14.0–49.0)
MCH: 31.7 pg (ref 27.2–33.4)
MCHC: 35.8 g/dL (ref 32.0–36.0)
MCV: 88.6 fL (ref 79.3–98.0)
MONO#: 0 10*3/uL — ABNORMAL LOW (ref 0.1–0.9)
MONO%: 0 % (ref 0.0–14.0)
NEUT%: 12.5 % — ABNORMAL LOW (ref 39.0–75.0)
NEUTROS ABS: 0 10*3/uL — AB (ref 1.5–6.5)
Platelets: <2 10*3/uL — CL (ref 145–400)
RBC: 2.9 10*6/uL — ABNORMAL LOW (ref 4.20–5.82)
RDW: 14.3 % (ref 11.0–14.6)
WBC: 0.3 10*3/uL — AB (ref 4.0–10.3)
nRBC: 0 % (ref 0–0)

## 2014-11-07 LAB — HOLD TUBE, BLOOD BANK

## 2014-11-07 MED ORDER — HEPARIN SOD (PORK) LOCK FLUSH 100 UNIT/ML IV SOLN
500.0000 [IU] | Freq: Every day | INTRAVENOUS | Status: AC | PRN
  Administered 2014-11-07: 500 [IU]
  Filled 2014-11-07: qty 5

## 2014-11-07 MED ORDER — ACETAMINOPHEN 325 MG PO TABS
ORAL_TABLET | ORAL | Status: AC
  Filled 2014-11-07: qty 2

## 2014-11-07 MED ORDER — SODIUM CHLORIDE 0.9 % IV SOLN
250.0000 mL | Freq: Once | INTRAVENOUS | Status: AC
  Administered 2014-11-07: 250 mL via INTRAVENOUS

## 2014-11-07 MED ORDER — MAGIC MOUTHWASH: 10.0000 mL | Freq: Four times a day (QID) | ORAL | Status: DC | PRN

## 2014-11-07 MED ORDER — SODIUM CHLORIDE 0.9 % IJ SOLN
10.0000 mL | INTRAMUSCULAR | Status: AC | PRN
  Administered 2014-11-07: 10 mL
  Filled 2014-11-07: qty 10

## 2014-11-07 MED ORDER — ACETAMINOPHEN 325 MG PO TABS
650.0000 mg | ORAL_TABLET | Freq: Once | ORAL | Status: AC
  Administered 2014-11-07: 650 mg via ORAL

## 2014-11-07 MED ORDER — DIPHENHYDRAMINE HCL 25 MG PO CAPS
ORAL_CAPSULE | ORAL | Status: AC
  Filled 2014-11-07: qty 1

## 2014-11-07 MED ORDER — DIPHENHYDRAMINE HCL 25 MG PO CAPS
25.0000 mg | ORAL_CAPSULE | Freq: Once | ORAL | Status: AC
  Administered 2014-11-07: 25 mg via ORAL

## 2014-11-07 NOTE — Telephone Encounter (Signed)
Labs faxed to [facility] 

## 2014-11-07 NOTE — Patient Instructions (Signed)
Platelet Transfusion Information This is information about transfusions of platelets. Platelets are tiny cells made by the bone marrow and found in the blood. When a blood vessel is damaged, platelets rush to the damaged area to help form a clot. This begins the healing process. When platelets get very low, your blood may have trouble clotting. This may be from:  Illness.  Blood disorder.  Chemotherapy to treat cancer. Often, lower platelet counts do not cause problems.  Platelets usually last for 7 to 10 days. If they are not used in an injury, they are broken down by the liver or spleen. Symptoms of low platelet count include:  Nosebleeds.  Bleeding gums.  Heavy periods.  Bruising and tiny blood spots in the skin.  Pinpoint spots of bleeding (petechiae).  Larger bruises (purpura).  Bleeding can be more serious if it happens in the brain or bowel. Platelet transfusions are often used to keep the platelet count at an acceptable level. Serious bleeding due to low platelets is uncommon. RISKS AND COMPLICATIONS Severe side effects from platelet transfusions are uncommon. Minor reactions may include:  Itching.  Rashes.  High temperature and shivering. Medications are available to stop transfusion reactions. Let your health care provider know if you develop any of the above problems.  If you are having platelet transfusions frequently, they may get less effective. This is called becoming refractory to platelets. It is uncommon. This can happen from non-immune causes and immune causes. Non-immune causes include:  High temperatures.  Some medications.  An enlarged spleen. Immune causes happen when your body discovers the platelets are not your own and begins making antibodies against them. The antibodies kill the platelets quickly. Even with platelet transfusions, you may still notice problems with bleeding or bruising. Let your health care providers know about this. Other things  can be done to help if this happens.  BEFORE THE PROCEDURE   Your health care provider will check your platelet count regularly.  If the platelet count is too low, it may be necessary to have a platelet transfusion.  This is more important before certain procedures with a risk of bleeding, such as a spinal tap.  Platelet transfusion reduces the risk of bleeding during or after the procedure.  Except in emergencies, giving a transfusion requires a written consent. Before blood is taken from a donor, a complete history is taken to make sure the person has no history of previous diseases, nor engages in risky social behavior. Examples of this are intravenous drug use or sexual activity with multiple partners. This could lead to infected blood or blood products being used. This history is taken in spite of the extensive testing to make sure the blood is safe. All blood products transfused are tested to make sure it is a match for the person getting the blood. It is also checked for infections. Blood is the safest it has ever been. The risk of getting an infection is very low. PROCEDURE  The platelets are stored in small plastic bags that are kept at a low temperature.  Each bag is called a unit and sometimes two units are given. They are given through an intravenous line by drip infusion over about one-half hour.  Usually blood is collected from multiple people to get enough to transfuse.  Sometimes, the platelets are collected from a single person. This is done using a special machine that separates the platelets from the blood. The machine is called an apheresis machine. Platelets collected in this   way are called apheresed platelets. Apheresed platelets reduce the risk of becoming sensitive to the platelets. This lowers the chances of having a transfusion reaction.  As it only takes a short time to give the platelets, this treatment can be given in an outpatient department. Platelets can also be  given before or after other treatments. SEEK IMMEDIATE MEDICAL CARE IF: You have any of the following symptoms over the next 12 hours or several days:  Shaking chills.  Fever with a temperature greater than 102F (38.9C) develops.  Back pain or muscle pain.  People around you feel you are not acting correctly, or you are confused.  Blood in the urine or bowel movements, or bleeding from any place in your body.  Shortness of breath, or difficulty breathing.  Dizziness.  Fainting.  You break out in a rash or develop hives.  Decrease in the amount of urine you are putting out, or the urine turns a dark color or changes to pink, red, or brown.  A severe headache or stiff neck.  Bruising more easily. Document Released: 06/08/2007 Document Revised: 12/26/2013 Document Reviewed: 06/08/2007 [facility] Patient Information 2015 [place], [place]. This information is not intended to replace advice given to you by your health care provider. Make sure you discuss any questions you have with your health care provider. Thrombocytopenia Thrombocytopenia means there are not enough platelets in your blood. Platelets are tiny cells in your blood. When you start bleeding, platelets clump together around the cut or injury to stop the bleeding. This process is called blood clotting. Not having enough platelets can cause bleeding problems. HOME CARE  Check your skin and inside your mouth for bruises or blood as told by your doctor.  Check your spit (sputum), pee (urine), and poop (stool) for blood as told by your doctor.  Do not do activities that can cause bumps or bruises until your doctor says it is okay.  Be careful not to cut yourself when you shave or use scissors, needles, knives, or other tools.  Be careful not to burn yourself when you iron or cook.  Ask your doctor if you can drink alcohol.  Only take medicines as told by your doctor.  Tell all your doctors and your dentist that you  have this bleeding problem. GET HELP RIGHT AWAY IF:  You are bleeding anywhere on your body.  You are bleeding or have bruises without knowing why.  You have blood in your spit, pee, or poop. MAKE SURE YOU:  Understand these instructions.  Will watch your condition.  Will get help right away if you are not doing well or get worse. Document Released: 07/31/2011 Document Revised: 11/03/2011 Document Reviewed: 07/31/2011 [facility] Patient Information 2015 [place], [place]. This information is not intended to replace advice given to you by your health care provider. Make sure you discuss any questions you have with your health care provider.

## 2014-11-08 LAB — PREPARE PLATELET PHERESIS: Unit division: 0

## 2014-11-10 ENCOUNTER — Encounter: Payer: Self-pay | Admitting: *Deleted

## 2014-11-10 ENCOUNTER — Other Ambulatory Visit (HOSPITAL_BASED_OUTPATIENT_CLINIC_OR_DEPARTMENT_OTHER): Payer: Medicaid Other

## 2014-11-10 ENCOUNTER — Other Ambulatory Visit: Payer: Self-pay | Admitting: Hematology and Oncology

## 2014-11-10 ENCOUNTER — Ambulatory Visit (HOSPITAL_BASED_OUTPATIENT_CLINIC_OR_DEPARTMENT_OTHER): Payer: Medicaid Other

## 2014-11-10 VITALS — BP 139/82 | HR 55 | Temp 98.0°F | Resp 18

## 2014-11-10 DIAGNOSIS — C9200 Acute myeloblastic leukemia, not having achieved remission: Secondary | ICD-10-CM

## 2014-11-10 LAB — CBC WITH DIFFERENTIAL/PLATELET
BASO%: 0 % (ref 0.0–2.0)
Basophils Absolute: 0 10*3/uL (ref 0.0–0.1)
EOS%: 0 % (ref 0.0–7.0)
Eosinophils Absolute: 0 10*3/uL (ref 0.0–0.5)
HCT: 22.6 % — ABNORMAL LOW (ref 38.4–49.9)
HGB: 8.1 g/dL — ABNORMAL LOW (ref 13.0–17.1)
LYMPH#: 0.4 10*3/uL — AB (ref 0.9–3.3)
LYMPH%: 100 % — AB (ref 14.0–49.0)
MCH: 31.4 pg (ref 27.2–33.4)
MCHC: 35.8 g/dL (ref 32.0–36.0)
MCV: 87.6 fL (ref 79.3–98.0)
MONO#: 0 10*3/uL — AB (ref 0.1–0.9)
MONO%: 0 % (ref 0.0–14.0)
NEUT%: 0 % — ABNORMAL LOW (ref 39.0–75.0)
NEUTROS ABS: 0 10*3/uL — AB (ref 1.5–6.5)
NRBC: 0 % (ref 0–0)
Platelets: 14 10*3/uL — ABNORMAL LOW (ref 140–400)
RBC: 2.58 10*6/uL — AB (ref 4.20–5.82)
RDW: 14.1 % (ref 11.0–14.6)
WBC: 0.4 10*3/uL — CL (ref 4.0–10.3)

## 2014-11-10 LAB — HOLD TUBE, BLOOD BANK

## 2014-11-10 MED ORDER — ACETAMINOPHEN 325 MG PO TABS
ORAL_TABLET | ORAL | Status: AC
Start: 2014-11-10 — End: 2014-11-10
  Filled 2014-11-10: qty 2

## 2014-11-10 MED ORDER — SODIUM CHLORIDE 0.9 % IJ SOLN
10.0000 mL | INTRAMUSCULAR | Status: AC | PRN
  Administered 2014-11-10: 10 mL
  Filled 2014-11-10: qty 10

## 2014-11-10 MED ORDER — DIPHENHYDRAMINE HCL 25 MG PO CAPS
25.0000 mg | ORAL_CAPSULE | Freq: Once | ORAL | Status: AC
  Administered 2014-11-10: 25 mg via ORAL

## 2014-11-10 MED ORDER — SODIUM CHLORIDE 0.9 % IV SOLN
250.0000 mL | Freq: Once | INTRAVENOUS | Status: AC
  Administered 2014-11-10: 250 mL via INTRAVENOUS

## 2014-11-10 MED ORDER — ACETAMINOPHEN 325 MG PO TABS
650.0000 mg | ORAL_TABLET | Freq: Once | ORAL | Status: AC
  Administered 2014-11-10: 650 mg via ORAL

## 2014-11-10 MED ORDER — DIPHENHYDRAMINE HCL 25 MG PO CAPS
ORAL_CAPSULE | ORAL | Status: AC
  Filled 2014-11-10: qty 1

## 2014-11-10 MED ORDER — HEPARIN SOD (PORK) LOCK FLUSH 100 UNIT/ML IV SOLN
500.0000 [IU] | Freq: Every day | INTRAVENOUS | Status: AC | PRN
  Administered 2014-11-10: 500 [IU]
  Filled 2014-11-10: qty 5

## 2014-11-10 NOTE — Progress Notes (Signed)
CBC results faxed to Dr. Linus Orn at [facility] fax [phone].

## 2014-11-10 NOTE — Patient Instructions (Signed)

## 2014-11-11 LAB — TYPE AND SCREEN
ABO/RH(D): A NEG
ANTIBODY SCREEN: NEGATIVE
UNIT DIVISION: 0
Unit division: 0

## 2014-11-13 NOTE — Progress Notes (Signed)
Late entry addendum for 11/10/14 -   Second unit of blood hung per protocol.  15 min vital signs taken at  1535.  Error in charting for 1540.

## 2014-11-14 ENCOUNTER — Ambulatory Visit: Payer: Medicaid Other

## 2014-11-14 ENCOUNTER — Telehealth: Payer: Self-pay | Admitting: *Deleted

## 2014-11-14 ENCOUNTER — Other Ambulatory Visit: Payer: Self-pay | Admitting: Hematology and Oncology

## 2014-11-14 ENCOUNTER — Other Ambulatory Visit: Payer: Self-pay | Admitting: *Deleted

## 2014-11-14 ENCOUNTER — Other Ambulatory Visit (HOSPITAL_BASED_OUTPATIENT_CLINIC_OR_DEPARTMENT_OTHER): Payer: Medicaid Other

## 2014-11-14 VITALS — BP 120/74 | HR 59 | Temp 98.1°F | Resp 18

## 2014-11-14 DIAGNOSIS — C9200 Acute myeloblastic leukemia, not having achieved remission: Secondary | ICD-10-CM

## 2014-11-14 LAB — COMPREHENSIVE METABOLIC PANEL (CC13)
ALBUMIN: 4 g/dL (ref 3.5–5.0)
ALK PHOS: 112 U/L (ref 40–150)
ALT: 29 U/L (ref 0–55)
AST: 25 U/L (ref 5–34)
Anion Gap: 10 mEq/L (ref 3–11)
BILIRUBIN TOTAL: 0.74 mg/dL (ref 0.20–1.20)
BUN: 18.7 mg/dL (ref 7.0–26.0)
CO2: 26 meq/L (ref 22–29)
Calcium: 9.6 mg/dL (ref 8.4–10.4)
Chloride: 102 mEq/L (ref 98–109)
Creatinine: 1.1 mg/dL (ref 0.7–1.3)
EGFR: >90 mL/min/{1.73_m2} (ref >90)
Glucose: 145 mg/dl — ABNORMAL HIGH (ref 70–140)
Potassium: 3.5 mEq/L (ref 3.5–5.1)
Sodium: 139 mEq/L (ref 136–145)
Total Protein: 7.6 g/dL (ref 6.4–8.3)

## 2014-11-14 LAB — CBC WITH DIFFERENTIAL/PLATELET
BASO%: 0 % (ref 0.0–2.0)
Basophils Absolute: 0 10*3/uL (ref 0.0–0.1)
EOS ABS: 0 10*3/uL (ref 0.0–0.5)
EOS%: 0 % (ref 0.0–7.0)
HEMATOCRIT: 29 % — AB (ref 38.4–49.9)
HGB: 9.8 g/dL — ABNORMAL LOW (ref 13.0–17.1)
LYMPH%: 93.3 % — ABNORMAL HIGH (ref 14.0–49.0)
MCH: 30.1 pg (ref 27.2–33.4)
MCHC: 33.8 g/dL (ref 32.0–36.0)
MCV: 88.9 fL (ref 79.3–98.0)
MONO#: 0 10*3/uL — AB (ref 0.1–0.9)
MONO%: 6 % (ref 0.0–14.0)
NEUT#: 0 10*3/uL — CL (ref 1.5–6.5)
NEUT%: 0.7 % — AB (ref 39.0–75.0)
Platelets: 4 10*3/uL — CL (ref 140–400)
RBC: 3.27 10*6/uL — ABNORMAL LOW (ref 4.20–5.82)
RDW: 15.6 % — ABNORMAL HIGH (ref 11.0–14.6)
WBC: 0.4 10*3/uL — CL (ref 4.0–10.3)
lymph#: 0.3 10*3/uL — ABNORMAL LOW (ref 0.9–3.3)

## 2014-11-14 LAB — HOLD TUBE, BLOOD BANK

## 2014-11-14 LAB — MAGNESIUM (CC13): Magnesium: 2.3 mg/dl (ref 1.5–2.5)

## 2014-11-14 MED ORDER — DIPHENHYDRAMINE HCL 25 MG PO CAPS
25.0000 mg | ORAL_CAPSULE | Freq: Once | ORAL | Status: AC
  Administered 2014-11-14: 25 mg via ORAL

## 2014-11-14 MED ORDER — ACETAMINOPHEN 325 MG PO TABS
650.0000 mg | ORAL_TABLET | Freq: Once | ORAL | Status: AC
  Administered 2014-11-14: 650 mg via ORAL

## 2014-11-14 MED ORDER — DIPHENHYDRAMINE HCL 25 MG PO CAPS
ORAL_CAPSULE | ORAL | Status: AC
  Filled 2014-11-14: qty 1

## 2014-11-14 MED ORDER — ACETAMINOPHEN 325 MG PO TABS
ORAL_TABLET | ORAL | Status: AC
  Filled 2014-11-14: qty 2

## 2014-11-14 NOTE — Progress Notes (Signed)
Faxed lab results to Dr. Linus Orn at [facility] fax [phone]

## 2014-11-14 NOTE — Patient Instructions (Signed)
Thrombocytopenia Thrombocytopenia means there are not enough platelets in your blood. Platelets are tiny cells in your blood. When you start bleeding, platelets clump together around the cut or injury to stop the bleeding. This process is called blood clotting. Not having enough platelets can cause bleeding problems. HOME CARE  Check your skin and inside your mouth for bruises or blood as told by your doctor.  Check your spit (sputum), pee (urine), and poop (stool) for blood as told by your doctor.  Do not do activities that can cause bumps or bruises until your doctor says it is okay.  Be careful not to cut yourself when you shave or use scissors, needles, knives, or other tools.  Be careful not to burn yourself when you iron or cook.  Ask your doctor if you can drink alcohol.  Only take medicines as told by your doctor.  Tell all your doctors and your dentist that you have this bleeding problem. GET HELP RIGHT AWAY IF:  You are bleeding anywhere on your body.  You are bleeding or have bruises without knowing why.  You have blood in your spit, pee, or poop. MAKE SURE YOU:  Understand these instructions.  Will watch your condition.  Will get help right away if you are not doing well or get worse. Document Released: 07/31/2011 Document Revised: 11/03/2011 Document Reviewed: 07/31/2011 [employer] Patient Information 2015 [employer]. This information is not intended to replace advice given to you by your health care provider. Make sure you discuss any questions you have with your health care provider.  

## 2014-11-14 NOTE — Telephone Encounter (Signed)
Per patient request I have moved appts from 3/25 to 3/24. Ok per desk RN  patient has appt time

## 2014-11-15 LAB — PREPARE PLATELET PHERESIS: Unit division: 0

## 2014-11-16 ENCOUNTER — Other Ambulatory Visit (HOSPITAL_BASED_OUTPATIENT_CLINIC_OR_DEPARTMENT_OTHER): Payer: Medicaid Other

## 2014-11-16 ENCOUNTER — Encounter: Payer: Self-pay | Admitting: *Deleted

## 2014-11-16 DIAGNOSIS — C9200 Acute myeloblastic leukemia, not having achieved remission: Secondary | ICD-10-CM

## 2014-11-16 LAB — CBC WITH DIFFERENTIAL/PLATELET
BASO%: 2.3 % — ABNORMAL HIGH (ref 0.0–2.0)
BASOS ABS: 0 10*3/uL (ref 0.0–0.1)
EOS ABS: 0 10*3/uL (ref 0.0–0.5)
EOS%: 0 % (ref 0.0–7.0)
HCT: 24.8 % — ABNORMAL LOW (ref 38.4–49.9)
HEMOGLOBIN: 8.9 g/dL — AB (ref 13.0–17.1)
LYMPH%: 69.8 % — ABNORMAL HIGH (ref 14.0–49.0)
MCH: 30.9 pg (ref 27.2–33.4)
MCHC: 35.9 g/dL (ref 32.0–36.0)
MCV: 86.1 fL (ref 79.3–98.0)
MONO#: 0.1 10*3/uL (ref 0.1–0.9)
MONO%: 18.6 % — ABNORMAL HIGH (ref 0.0–14.0)
NEUT%: 9.3 % — ABNORMAL LOW (ref 39.0–75.0)
NEUTROS ABS: 0 10*3/uL — AB (ref 1.5–6.5)
NRBC: 0 % (ref 0–0)
Platelets: 19 10*3/uL — ABNORMAL LOW (ref 140–400)
RBC: 2.88 10*6/uL — AB (ref 4.20–5.82)
RDW: 13.5 % (ref 11.0–14.6)
WBC: 0.4 10*3/uL — CL (ref 4.0–10.3)
lymph#: 0.3 10*3/uL — ABNORMAL LOW (ref 0.9–3.3)

## 2014-11-16 LAB — HOLD TUBE, BLOOD BANK

## 2014-11-16 NOTE — Progress Notes (Signed)
Informed pt no need for transfusions today.  Reinforced neutropenic precautions and to return for next lab on Tues 3/29 as scheduled. He verbalized understanding.  Faxed CBC results to [facility] at fax [phone]

## 2014-11-17 ENCOUNTER — Other Ambulatory Visit: Payer: Self-pay

## 2014-11-21 ENCOUNTER — Ambulatory Visit: Payer: Medicaid Other

## 2014-11-21 ENCOUNTER — Encounter: Payer: Self-pay | Admitting: *Deleted

## 2014-11-21 ENCOUNTER — Other Ambulatory Visit (HOSPITAL_BASED_OUTPATIENT_CLINIC_OR_DEPARTMENT_OTHER): Payer: Medicaid Other

## 2014-11-21 DIAGNOSIS — C9200 Acute myeloblastic leukemia, not having achieved remission: Secondary | ICD-10-CM

## 2014-11-21 LAB — CBC WITH DIFFERENTIAL/PLATELET
BASO%: 0 % (ref 0.0–2.0)
Basophils Absolute: 0 10*3/uL (ref 0.0–0.1)
EOS%: 0 % (ref 0.0–7.0)
Eosinophils Absolute: 0 10*3/uL (ref 0.0–0.5)
HEMATOCRIT: 24.2 % — AB (ref 38.4–49.9)
HEMOGLOBIN: 8.4 g/dL — AB (ref 13.0–17.1)
LYMPH%: 34 % (ref 14.0–49.0)
MCH: 30 pg (ref 27.2–33.4)
MCHC: 34.5 g/dL (ref 32.0–36.0)
MCV: 87.1 fL (ref 79.3–98.0)
MONO#: 0.3 10*3/uL (ref 0.1–0.9)
MONO%: 27.4 % — AB (ref 0.0–14.0)
NEUT#: 0.5 10*3/uL — CL (ref 1.5–6.5)
NEUT%: 38.6 % — ABNORMAL LOW (ref 39.0–75.0)
PLATELETS: 18 10*3/uL — AB (ref 140–400)
RBC: 2.78 10*6/uL — ABNORMAL LOW (ref 4.20–5.82)
RDW: 15.5 % — ABNORMAL HIGH (ref 11.0–14.6)
WBC: 1.3 10*3/uL — ABNORMAL LOW (ref 4.0–10.3)
lymph#: 0.4 10*3/uL — ABNORMAL LOW (ref 0.9–3.3)

## 2014-11-21 LAB — COMPREHENSIVE METABOLIC PANEL (CC13)
ALT: 28 U/L (ref 0–55)
AST: 30 U/L (ref 5–34)
Albumin: 4 g/dL (ref 3.5–5.0)
Alkaline Phosphatase: 103 U/L (ref 40–150)
Anion Gap: 9 mEq/L (ref 3–11)
BUN: 17.1 mg/dL (ref 7.0–26.0)
CALCIUM: 9.5 mg/dL (ref 8.4–10.4)
CO2: 27 mEq/L (ref 22–29)
Chloride: 104 mEq/L (ref 98–109)
Creatinine: 1.2 mg/dL (ref 0.7–1.3)
EGFR: 84 mL/min/{1.73_m2} — ABNORMAL LOW (ref >90)
GLUCOSE: 172 mg/dL — AB (ref 70–140)
Potassium: 3.7 mEq/L (ref 3.5–5.1)
Sodium: 140 mEq/L (ref 136–145)
TOTAL PROTEIN: 7.5 g/dL (ref 6.4–8.3)
Total Bilirubin: 0.46 mg/dL (ref 0.20–1.20)

## 2014-11-21 LAB — MAGNESIUM (CC13): MAGNESIUM: 2.2 mg/dL (ref 1.5–2.5)

## 2014-11-21 LAB — HOLD TUBE, BLOOD BANK

## 2014-11-21 NOTE — Progress Notes (Signed)
Hgb 8.4, plt 18 today. Per transfusion threshold established in last MD note, patient does not require transfusion today. Spoke with pt in lobby, he feels well, denies bleeding, increased SOB, weakness, or fatigue. Patient verbalizes understanding he does not need transfusion today and was given a copy of his labs. Pt ambulates out of lobby to home independent and in stable condition. Informed Dr. Calton Dach desk RN of labs.

## 2014-11-21 NOTE — Progress Notes (Signed)
CBC faxed to [facility] fax [phone]

## 2014-11-24 ENCOUNTER — Ambulatory Visit (HOSPITAL_COMMUNITY)
Admission: RE | Admit: 2014-11-24 | Discharge: 2014-11-24 | Disposition: A | Discharge status: Home / Self Care | Payer: Medicaid Other | Source: Ambulatory Visit | Attending: Hematology and Oncology | Admitting: Hematology and Oncology

## 2014-11-24 ENCOUNTER — Other Ambulatory Visit: Payer: Self-pay | Admitting: Hematology and Oncology

## 2014-11-24 ENCOUNTER — Ambulatory Visit (HOSPITAL_BASED_OUTPATIENT_CLINIC_OR_DEPARTMENT_OTHER): Payer: Medicaid Other

## 2014-11-24 ENCOUNTER — Other Ambulatory Visit (HOSPITAL_BASED_OUTPATIENT_CLINIC_OR_DEPARTMENT_OTHER): Payer: Medicaid Other

## 2014-11-24 ENCOUNTER — Other Ambulatory Visit: Payer: Self-pay | Admitting: *Deleted

## 2014-11-24 ENCOUNTER — Encounter: Payer: Self-pay | Admitting: *Deleted

## 2014-11-24 VITALS — BP 129/77 | HR 59 | Temp 97.9°F | Resp 18

## 2014-11-24 DIAGNOSIS — C9200 Acute myeloblastic leukemia, not having achieved remission: Secondary | ICD-10-CM

## 2014-11-24 LAB — COMPREHENSIVE METABOLIC PANEL (CC13)
ALK PHOS: 99 U/L (ref 40–150)
ALT: 28 U/L (ref 0–55)
ANION GAP: 10 meq/L (ref 3–11)
AST: 31 U/L (ref 5–34)
Albumin: 4.1 g/dL (ref 3.5–5.0)
BUN: 19.5 mg/dL (ref 7.0–26.0)
CALCIUM: 9.5 mg/dL (ref 8.4–10.4)
CO2: 26 mEq/L (ref 22–29)
Chloride: 104 mEq/L (ref 98–109)
Creatinine: 1.2 mg/dL (ref 0.7–1.3)
EGFR: 80 mL/min/{1.73_m2} — AB (ref >90)
GLUCOSE: 129 mg/dL (ref 70–140)
POTASSIUM: 4.2 meq/L (ref 3.5–5.1)
SODIUM: 141 meq/L (ref 136–145)
TOTAL PROTEIN: 7.4 g/dL (ref 6.4–8.3)
Total Bilirubin: 0.45 mg/dL (ref 0.20–1.20)

## 2014-11-24 LAB — CBC WITH DIFFERENTIAL/PLATELET
BASO%: 0 % (ref 0.0–2.0)
Basophils Absolute: 0 10*3/uL (ref 0.0–0.1)
EOS ABS: 0 10*3/uL (ref 0.0–0.5)
EOS%: 0 % (ref 0.0–7.0)
HCT: 21.8 % — ABNORMAL LOW (ref 38.4–49.9)
HEMOGLOBIN: 7.8 g/dL — AB (ref 13.0–17.1)
LYMPH%: 32.1 % (ref 14.0–49.0)
MCH: 30.6 pg (ref 27.2–33.4)
MCHC: 35.8 g/dL (ref 32.0–36.0)
MCV: 85.5 fL (ref 79.3–98.0)
MONO#: 0.5 10*3/uL (ref 0.1–0.9)
MONO%: 27.3 % — ABNORMAL HIGH (ref 0.0–14.0)
NEUT%: 40.6 % (ref 39.0–75.0)
NEUTROS ABS: 0.8 10*3/uL — AB (ref 1.5–6.5)
PLATELETS: 21 10*3/uL — AB (ref 140–400)
RBC: 2.55 10*6/uL — ABNORMAL LOW (ref 4.20–5.82)
RDW: 13.8 % (ref 11.0–14.6)
WBC: 1.9 10*3/uL — ABNORMAL LOW (ref 4.0–10.3)
lymph#: 0.6 10*3/uL — ABNORMAL LOW (ref 0.9–3.3)
nRBC: 1 % — ABNORMAL HIGH (ref 0–0)

## 2014-11-24 LAB — MAGNESIUM (CC13): MAGNESIUM: 2.4 mg/dL (ref 1.5–2.5)

## 2014-11-24 LAB — HOLD TUBE, BLOOD BANK

## 2014-11-24 LAB — PREPARE RBC (CROSSMATCH)

## 2014-11-24 MED ORDER — ACETAMINOPHEN 325 MG PO TABS
ORAL_TABLET | ORAL | Status: AC
  Filled 2014-11-24: qty 2

## 2014-11-24 MED ORDER — SODIUM CHLORIDE 0.9 % IJ SOLN
10.0000 mL | INTRAMUSCULAR | Status: DC | PRN
  Administered 2014-11-24: 10 mL via INTRAVENOUS
  Filled 2014-11-24: qty 10

## 2014-11-24 MED ORDER — ACETAMINOPHEN 325 MG PO TABS
650.0000 mg | ORAL_TABLET | Freq: Once | ORAL | Status: AC
  Administered 2014-11-24: 650 mg via ORAL

## 2014-11-24 MED ORDER — HEPARIN SOD (PORK) LOCK FLUSH 100 UNIT/ML IV SOLN
500.0000 [IU] | Freq: Once | INTRAVENOUS | Status: AC
  Administered 2014-11-24: 500 [IU] via INTRAVENOUS
  Filled 2014-11-24: qty 5

## 2014-11-24 MED ORDER — DIPHENHYDRAMINE HCL 25 MG PO CAPS
ORAL_CAPSULE | ORAL | Status: AC
  Filled 2014-11-24: qty 1

## 2014-11-24 MED ORDER — SODIUM CHLORIDE 0.9 % IV SOLN
250.0000 mL | Freq: Once | INTRAVENOUS | Status: AC
  Administered 2014-11-24: 250 mL via INTRAVENOUS

## 2014-11-24 MED ORDER — DIPHENHYDRAMINE HCL 25 MG PO CAPS
25.0000 mg | ORAL_CAPSULE | Freq: Once | ORAL | Status: AC
  Administered 2014-11-24: 25 mg via ORAL

## 2014-11-24 NOTE — Progress Notes (Signed)
Lab results faxed to [facility] at fax [phone].

## 2014-11-24 NOTE — Patient Instructions (Signed)

## 2014-11-24 NOTE — Addendum Note (Signed)
Addended by: Oliver Hum on: 11/24/2014 04:55 PM   Modules accepted: Orders

## 2014-11-25 LAB — TYPE AND SCREEN
ABO/RH(D): A NEG
ANTIBODY SCREEN: NEGATIVE
UNIT DIVISION: 0
Unit division: 0

## 2014-11-25 LAB — PREPARE RBC (CROSSMATCH)

## 2014-11-28 ENCOUNTER — Encounter: Payer: Self-pay | Admitting: *Deleted

## 2014-11-28 ENCOUNTER — Other Ambulatory Visit (HOSPITAL_BASED_OUTPATIENT_CLINIC_OR_DEPARTMENT_OTHER): Payer: Medicaid Other

## 2014-11-28 DIAGNOSIS — C9200 Acute myeloblastic leukemia, not having achieved remission: Secondary | ICD-10-CM | POA: Diagnosis not present

## 2014-11-28 LAB — CBC WITH DIFFERENTIAL/PLATELET
BASO%: 0 % (ref 0.0–2.0)
Basophils Absolute: 0 10*3/uL (ref 0.0–0.1)
EOS ABS: 0 10*3/uL (ref 0.0–0.5)
EOS%: 0 % (ref 0.0–7.0)
HCT: 26.3 % — ABNORMAL LOW (ref 38.4–49.9)
HGB: 9.4 g/dL — ABNORMAL LOW (ref 13.0–17.1)
LYMPH#: 0.5 10*3/uL — AB (ref 0.9–3.3)
LYMPH%: 24.3 % (ref 14.0–49.0)
MCH: 31.4 pg (ref 27.2–33.4)
MCHC: 35.7 g/dL (ref 32.0–36.0)
MCV: 88 fL (ref 79.3–98.0)
MONO#: 0.3 10*3/uL (ref 0.1–0.9)
MONO%: 17.3 % — AB (ref 0.0–14.0)
NEUT#: 1.1 10*3/uL — ABNORMAL LOW (ref 1.5–6.5)
NEUT%: 58.4 % (ref 39.0–75.0)
Platelets: 42 10*3/uL — ABNORMAL LOW (ref 140–400)
RBC: 2.99 10*6/uL — ABNORMAL LOW (ref 4.20–5.82)
RDW: 14.5 % (ref 11.0–14.6)
WBC: 1.9 10*3/uL — ABNORMAL LOW (ref 4.0–10.3)

## 2014-11-28 LAB — COMPREHENSIVE METABOLIC PANEL (CC13)
ALT: 30 U/L (ref 0–55)
AST: 36 U/L — AB (ref 5–34)
Albumin: 3.9 g/dL (ref 3.5–5.0)
Alkaline Phosphatase: 98 U/L (ref 40–150)
Anion Gap: 7 mEq/L (ref 3–11)
BILIRUBIN TOTAL: 0.33 mg/dL (ref 0.20–1.20)
BUN: 14 mg/dL (ref 7.0–26.0)
CHLORIDE: 107 meq/L (ref 98–109)
CO2: 29 mEq/L (ref 22–29)
CREATININE: 1.1 mg/dL (ref 0.7–1.3)
Calcium: 9.1 mg/dL (ref 8.4–10.4)
EGFR: >90 mL/min/{1.73_m2} (ref >90)
Glucose: 114 mg/dl (ref 70–140)
Potassium: 4 mEq/L (ref 3.5–5.1)
SODIUM: 143 meq/L (ref 136–145)
Total Protein: 7.4 g/dL (ref 6.4–8.3)

## 2014-11-28 LAB — HOLD TUBE, BLOOD BANK

## 2014-11-28 LAB — MAGNESIUM (CC13): MAGNESIUM: 2.5 mg/dL (ref 1.5–2.5)

## 2014-11-28 NOTE — Progress Notes (Signed)
Per Infusion room RN, Derrick Callahan, pt has appt at [facility] this week on 4/7 for BMBx.  He was informed he does not need to stay for transfusion today.  CBC faxed to [facility] fax [phone].  Pt is not scheduled back here at this time.  We will wait to hear from [facility] after BMBx about follow up needed.

## 2014-11-30 ENCOUNTER — Other Ambulatory Visit: Payer: Self-pay | Admitting: Hematology and Oncology

## 2014-11-30 ENCOUNTER — Telehealth: Payer: Self-pay | Admitting: *Deleted

## 2014-11-30 ENCOUNTER — Telehealth: Payer: Self-pay | Admitting: Hematology and Oncology

## 2014-11-30 DIAGNOSIS — C9200 Acute myeloblastic leukemia, not having achieved remission: Secondary | ICD-10-CM

## 2014-11-30 NOTE — Telephone Encounter (Signed)
Received new order for labs to be done on Tuesday and Fridays for two weeks starting tomorrow on Friday 4/8.   POF sent to scheduler.   CBC w/ diff twice weekly.  CMET and Magnesium weekly on Tuesdays.   POF sent to scheduler for lab only appts.  (Will add on transfusion appts as needed).

## 2014-11-30 NOTE — Telephone Encounter (Signed)
Lab orders are in. If only this month I do not have to see him If more than 1 month, I will have to see him No need transfusion appt at this point

## 2014-11-30 NOTE — Telephone Encounter (Signed)
Lft msg for pt to return our call to confirm labs for next 2 wks per 04/07 POF and I will leave updated schedule at front desk for pt to pick up.Marland Kitchen KJ

## 2014-11-30 NOTE — Telephone Encounter (Signed)
Received orders from [facility] requesting pt have labs done at our clinic Mondays and Thursdays starting today 4/7.  No other information or transfusion parameters given.  Called [facility] and s/w RN, Margarita Grizzle to clarify orders and ask how many weeks pt needs to be scheduled?  Also informed we can do Tuesdays and Fridays easier than Mondays and Thursdays if that is ok.  Margarita Grizzle said she will have a nurse named Elmyra Ricks call me back w/ more information.

## 2014-12-01 ENCOUNTER — Other Ambulatory Visit (HOSPITAL_BASED_OUTPATIENT_CLINIC_OR_DEPARTMENT_OTHER): Payer: Medicaid Other

## 2014-12-01 ENCOUNTER — Telehealth: Payer: Self-pay | Admitting: Hematology and Oncology

## 2014-12-01 ENCOUNTER — Other Ambulatory Visit: Payer: Self-pay | Admitting: Nurse Practitioner

## 2014-12-01 DIAGNOSIS — C9200 Acute myeloblastic leukemia, not having achieved remission: Secondary | ICD-10-CM

## 2014-12-01 LAB — CBC WITH DIFFERENTIAL/PLATELET
BASO%: 0 % (ref 0.0–2.0)
BASOS ABS: 0 10*3/uL (ref 0.0–0.1)
EOS ABS: 0 10*3/uL (ref 0.0–0.5)
EOS%: 0 % (ref 0.0–7.0)
HEMATOCRIT: 27.2 % — AB (ref 38.4–49.9)
HGB: 9.6 g/dL — ABNORMAL LOW (ref 13.0–17.1)
LYMPH%: 22.7 % (ref 14.0–49.0)
MCH: 31.3 pg (ref 27.2–33.4)
MCHC: 35.3 g/dL (ref 32.0–36.0)
MCV: 88.6 fL (ref 79.3–98.0)
MONO#: 0.3 10*3/uL (ref 0.1–0.9)
MONO%: 13 % (ref 0.0–14.0)
NEUT%: 64.3 % (ref 39.0–75.0)
NEUTROS ABS: 1.3 10*3/uL — AB (ref 1.5–6.5)
Platelets: 57 10*3/uL — ABNORMAL LOW (ref 140–400)
RBC: 3.07 10*6/uL — ABNORMAL LOW (ref 4.20–5.82)
RDW: 15 % — AB (ref 11.0–14.6)
WBC: 2.1 10*3/uL — ABNORMAL LOW (ref 4.0–10.3)
lymph#: 0.5 10*3/uL — ABNORMAL LOW (ref 0.9–3.3)
nRBC: 2 % — ABNORMAL HIGH (ref 0–0)

## 2014-12-01 LAB — HOLD TUBE, BLOOD BANK

## 2014-12-01 NOTE — Progress Notes (Signed)
WBC 2.1, ANC 1.3, hemoglobin 9.6, and platelet count 57 today.    Per transfusion/order parameters from [facility] does not require any transfusions today.  This cancer center will fax results to fax [phone].

## 2014-12-01 NOTE — Telephone Encounter (Signed)
returned call and confirmed appt.....pt ok and aware °

## 2014-12-05 ENCOUNTER — Other Ambulatory Visit (HOSPITAL_BASED_OUTPATIENT_CLINIC_OR_DEPARTMENT_OTHER): Payer: Medicaid Other

## 2014-12-05 ENCOUNTER — Telehealth: Payer: Self-pay | Admitting: *Deleted

## 2014-12-05 DIAGNOSIS — C9200 Acute myeloblastic leukemia, not having achieved remission: Secondary | ICD-10-CM

## 2014-12-05 LAB — CBC WITH DIFFERENTIAL/PLATELET
BASO%: 0.4 % (ref 0.0–2.0)
Basophils Absolute: 0 10*3/uL (ref 0.0–0.1)
EOS ABS: 0 10*3/uL (ref 0.0–0.5)
EOS%: 0 % (ref 0.0–7.0)
HCT: 26.7 % — ABNORMAL LOW (ref 38.4–49.9)
HEMOGLOBIN: 9.3 g/dL — AB (ref 13.0–17.1)
LYMPH%: 17 % (ref 14.0–49.0)
MCH: 32.1 pg (ref 27.2–33.4)
MCHC: 34.8 g/dL (ref 32.0–36.0)
MCV: 92.1 fL (ref 79.3–98.0)
MONO#: 0.4 10*3/uL (ref 0.1–0.9)
MONO%: 15.5 % — ABNORMAL HIGH (ref 0.0–14.0)
NEUT%: 67.1 % (ref 39.0–75.0)
NEUTROS ABS: 1.8 10*3/uL (ref 1.5–6.5)
PLATELETS: 71 10*3/uL — AB (ref 140–400)
RBC: 2.9 10*6/uL — ABNORMAL LOW (ref 4.20–5.82)
RDW: 16.8 % — AB (ref 11.0–14.6)
WBC: 2.7 10*3/uL — ABNORMAL LOW (ref 4.0–10.3)
lymph#: 0.5 10*3/uL — ABNORMAL LOW (ref 0.9–3.3)

## 2014-12-05 LAB — COMPREHENSIVE METABOLIC PANEL (CC13)
ALT: 36 U/L (ref 0–55)
ANION GAP: 7 meq/L (ref 3–11)
AST: 40 U/L — AB (ref 5–34)
Albumin: 3.9 g/dL (ref 3.5–5.0)
Alkaline Phosphatase: 91 U/L (ref 40–150)
BUN: 15.4 mg/dL (ref 7.0–26.0)
CHLORIDE: 106 meq/L (ref 98–109)
CO2: 27 mEq/L (ref 22–29)
Calcium: 8.7 mg/dL (ref 8.4–10.4)
Creatinine: 1.1 mg/dL (ref 0.7–1.3)
EGFR: >90 mL/min/{1.73_m2} (ref >90)
GLUCOSE: 144 mg/dL — AB (ref 70–140)
Potassium: 3.9 mEq/L (ref 3.5–5.1)
SODIUM: 141 meq/L (ref 136–145)
TOTAL PROTEIN: 6.9 g/dL (ref 6.4–8.3)
Total Bilirubin: 0.46 mg/dL (ref 0.20–1.20)

## 2014-12-05 LAB — MAGNESIUM (CC13): MAGNESIUM: 2.4 mg/dL (ref 1.5–2.5)

## 2014-12-05 LAB — HOLD TUBE, BLOOD BANK

## 2014-12-05 NOTE — Telephone Encounter (Signed)
Informed pt no need for transfusion today. He verbalized understanding.  States he has BMBx at [facility] this Thursday along w/ labs so he will likely not need to have labs here as scheduled this Friday.  Instructed pt to ask [facility] if he needs to keep lab appt but likely not.  He verbalized understanding.

## 2014-12-08 ENCOUNTER — Other Ambulatory Visit: Payer: Medicaid Other

## 2014-12-12 ENCOUNTER — Other Ambulatory Visit (HOSPITAL_BASED_OUTPATIENT_CLINIC_OR_DEPARTMENT_OTHER): Payer: Medicaid Other

## 2014-12-12 ENCOUNTER — Encounter: Payer: Self-pay | Admitting: *Deleted

## 2014-12-12 DIAGNOSIS — C9200 Acute myeloblastic leukemia, not having achieved remission: Secondary | ICD-10-CM

## 2014-12-12 LAB — COMPREHENSIVE METABOLIC PANEL (CC13)
ALBUMIN: 4 g/dL (ref 3.5–5.0)
ALK PHOS: 86 U/L (ref 40–150)
ALT: 32 U/L (ref 0–55)
AST: 32 U/L (ref 5–34)
Anion Gap: 13 mEq/L — ABNORMAL HIGH (ref 3–11)
BUN: 10.1 mg/dL (ref 7.0–26.0)
CO2: 26 meq/L (ref 22–29)
Calcium: 9.2 mg/dL (ref 8.4–10.4)
Chloride: 103 mEq/L (ref 98–109)
Creatinine: 1.2 mg/dL (ref 0.7–1.3)
EGFR: 86 mL/min/{1.73_m2} — AB (ref >90)
Glucose: 103 mg/dl (ref 70–140)
POTASSIUM: 3.7 meq/L (ref 3.5–5.1)
Sodium: 142 mEq/L (ref 136–145)
TOTAL PROTEIN: 7.4 g/dL (ref 6.4–8.3)
Total Bilirubin: 0.5 mg/dL (ref 0.20–1.20)

## 2014-12-12 LAB — CBC WITH DIFFERENTIAL/PLATELET
BASO%: 0 % (ref 0.0–2.0)
BASOS ABS: 0 10*3/uL (ref 0.0–0.1)
EOS ABS: 0 10*3/uL (ref 0.0–0.5)
EOS%: 0.7 % (ref 0.0–7.0)
HEMATOCRIT: 30.6 % — AB (ref 38.4–49.9)
HEMOGLOBIN: 10.5 g/dL — AB (ref 13.0–17.1)
LYMPH%: 20.3 % (ref 14.0–49.0)
MCH: 32.7 pg (ref 27.2–33.4)
MCHC: 34.3 g/dL (ref 32.0–36.0)
MCV: 95.3 fL (ref 79.3–98.0)
MONO#: 0.4 10*3/uL (ref 0.1–0.9)
MONO%: 13.1 % (ref 0.0–14.0)
NEUT%: 65.9 % (ref 39.0–75.0)
NEUTROS ABS: 1.9 10*3/uL (ref 1.5–6.5)
Platelets: 99 10*3/uL — ABNORMAL LOW (ref 140–400)
RBC: 3.21 10*6/uL — ABNORMAL LOW (ref 4.20–5.82)
RDW: 19.2 % — AB (ref 11.0–14.6)
WBC: 2.9 10*3/uL — ABNORMAL LOW (ref 4.0–10.3)
lymph#: 0.6 10*3/uL — ABNORMAL LOW (ref 0.9–3.3)

## 2014-12-12 LAB — HOLD TUBE, BLOOD BANK

## 2014-12-12 LAB — MAGNESIUM (CC13): MAGNESIUM: 2.4 mg/dL (ref 1.5–2.5)

## 2014-12-12 NOTE — Progress Notes (Signed)
Labs faxed to [facility] fax [phone]

## 2014-12-15 ENCOUNTER — Other Ambulatory Visit: Payer: Medicaid Other

## 2016-05-16 ENCOUNTER — Encounter (HOSPITAL_COMMUNITY): Payer: Self-pay | Admitting: *Deleted

## 2016-05-16 ENCOUNTER — Emergency Department (HOSPITAL_COMMUNITY)
Admission: EM | Admit: 2016-05-16 | Discharge: 2016-05-17 | Discharge status: Against Medical Advice | Payer: Medicaid Other | Attending: Emergency Medicine | Admitting: Emergency Medicine

## 2016-05-16 DIAGNOSIS — N179 Acute kidney failure, unspecified: Secondary | ICD-10-CM | POA: Diagnosis not present

## 2016-05-16 DIAGNOSIS — F17200 Nicotine dependence, unspecified, uncomplicated: Secondary | ICD-10-CM | POA: Insufficient documentation

## 2016-05-16 DIAGNOSIS — M6282 Rhabdomyolysis: Secondary | ICD-10-CM

## 2016-05-16 DIAGNOSIS — T675XXA Heat exhaustion, unspecified, initial encounter: Secondary | ICD-10-CM | POA: Diagnosis present

## 2016-05-16 DIAGNOSIS — I10 Essential (primary) hypertension: Secondary | ICD-10-CM | POA: Diagnosis not present

## 2016-05-16 LAB — URINALYSIS, ROUTINE W REFLEX MICROSCOPIC
Glucose, UA: NEGATIVE mg/dL
Hgb urine dipstick: NEGATIVE
Ketones, ur: 15 mg/dL — AB
LEUKOCYTES UA: NEGATIVE
NITRITE: NEGATIVE
PROTEIN: NEGATIVE mg/dL
Specific Gravity, Urine: 1.025 (ref 1.005–1.030)
pH: 5 (ref 5.0–8.0)

## 2016-05-16 LAB — BASIC METABOLIC PANEL
Anion gap: 14 (ref 5–15)
BUN: 20 mg/dL (ref 6–20)
CO2: 23 mmol/L (ref 22–32)
Calcium: 9.8 mg/dL (ref 8.9–10.3)
Chloride: 98 mmol/L — ABNORMAL LOW (ref 101–111)
Creatinine, Ser: 2.3 mg/dL — ABNORMAL HIGH (ref 0.61–1.24)
GFR calc Af Amer: 37 mL/min — ABNORMAL LOW (ref >60)
GFR, EST NON AFRICAN AMERICAN: 32 mL/min — AB (ref >60)
GLUCOSE: 139 mg/dL — AB (ref 65–99)
POTASSIUM: 3.5 mmol/L (ref 3.5–5.1)
SODIUM: 135 mmol/L (ref 135–145)

## 2016-05-16 LAB — CBC
HCT: 46.9 % (ref 39.0–52.0)
Hemoglobin: 16.7 g/dL (ref 13.0–17.0)
MCH: 32.7 pg (ref 26.0–34.0)
MCHC: 35.6 g/dL (ref 30.0–36.0)
MCV: 91.8 fL (ref 78.0–100.0)
Platelets: 120 10*3/uL — ABNORMAL LOW (ref 150–400)
RBC: 5.11 MIL/uL (ref 4.22–5.81)
RDW: 12.3 % (ref 11.5–15.5)
WBC: 7 10*3/uL (ref 4.0–10.5)

## 2016-05-16 LAB — CK: Total CK: 812 U/L — ABNORMAL HIGH (ref 49–397)

## 2016-05-16 MED ORDER — SODIUM CHLORIDE 0.9 % IV BOLUS (SEPSIS)
2000.0000 mL | INTRAVENOUS | Status: AC
  Administered 2016-05-16: 2000 mL via INTRAVENOUS

## 2016-05-16 NOTE — ED Triage Notes (Signed)
The pt is c/o having cramps all over his body after working out in the heat all day.  His cramps started aroound 1800 today.  He drank water and gator ade all day

## 2016-05-16 NOTE — ED Provider Notes (Signed)
[facility] DEPT Provider Note   CSN: [id number] Arrival date & time: 05/16/16  2129     History   Chief Complaint Chief Complaint  Patient presents with  . Heat Exposure    HPI Derrick Callahan is a 45 y.o. male with a hx of AML (2015 now in remission), HTN presents to the Emergency Department complaining of gradual, persistent, progressively worsening body cramps onset 6:30.  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  Pt reports he drank 4 bottles of water and 3 bottles of Gatorade.  Pt reports movement makes the cramps worse.  Massage helps the cramping.  Pt denies previous symptoms.   Pt denies fever, chills, headache, neck pain, chest pain, SOB, abd pain, N/V/D.      The history is provided by the patient and medical records. No language interpreter was used.    Past Medical History:  Diagnosis Date  . AML (acute myelogenous leukemia) ([place]) 07/26/2014  . Hypertension     Patient Active Problem List   Diagnosis Date Noted  . Pancytopenia due to antineoplastic chemotherapy ([place]) 10/04/2014  . Problem related to psychosocial circumstances 10/04/2014  . Thrombocytopenia due to drugs 07/27/2014  . Leukopenia due to antineoplastic chemotherapy 07/27/2014  . Fungal pneumonia 07/27/2014  . 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 Elevated liver enzymes 07/27/2014  . Tobacco dependency 07/27/2014  . AML (acute myelogenous leukemia) ([place]) 07/26/2014    Past Surgical History:  Procedure Laterality Date  . PORTACATH PLACEMENT         Home Medications    Prior to Admission medications   Not on File    Family History Family History  Problem Relation Age of Onset  . Cancer Maternal Grandmother     unknown ca    Social History Social History  Substance Use Topics  . Smoking status: Current Every Day Smoker    Packs/day: 0.20    Years: 24.00  . Smokeless tobacco: Never Used  . Alcohol use 0.6 oz/week    1 Shots of liquor per week     Allergies    Review of patient's allergies indicates no known allergies.   Review of Systems Review of Systems  Musculoskeletal: Positive for myalgias.       Muscle cramps  All other systems reviewed and are negative.    Physical Exam Updated Vital Signs BP 119/86   Pulse 92   Temp 98.2 F (36.8 C) (Oral)   Resp 18   SpO2 98% Comment: Simultaneous filing. User may not have seen previous data.  Physical Exam  Constitutional: He appears well-developed and well-nourished. No distress.  Awake, alert, nontoxic appearance  HENT:  Head: Normocephalic and atraumatic.  Mouth/Throat: Oropharynx is clear and moist. No oropharyngeal exudate.  Eyes: Conjunctivae are normal. No scleral icterus.  Neck: Normal range of motion. Neck supple.  Cardiovascular: Normal rate, regular rhythm and intact distal pulses.   Pulmonary/Chest: Effort normal and breath sounds normal. No respiratory distress. He has no wheezes.  Equal chest expansion  Abdominal: Soft. Bowel sounds are normal. He exhibits no mass. There is no tenderness. There is no rebound and no guarding.  Musculoskeletal: Normal range of motion. He exhibits no edema.  Neurological: He is alert.  Speech is clear and goal oriented Moves extremities without ataxia  Skin: Skin is warm and dry. Rash noted. He is not diaphoretic.  Lower and upper legs needed to have erythematous rash with numerous pustules around the follicles consistent with folliculitis; no expanding  erythema, increased warmth or induration. No ulcerations. No vesicles.  Psychiatric: He has a normal mood and affect.  Nursing note and vitals reviewed.    ED Treatments / Results  Labs (all labs ordered are listed, but only abnormal results are displayed) Labs Reviewed  CBC - Abnormal; Notable for the following:       Result Value   Platelets 120 (*)    All other components within normal limits  BASIC METABOLIC PANEL - Abnormal; Notable for the following:    Chloride 98 (*)     Glucose, Bld 139 (*)    Creatinine, Ser 2.30 (*)    GFR calc non Af Amer 32 (*)    GFR calc Af Amer 37 (*)    All other components within normal limits  URINALYSIS, ROUTINE W REFLEX MICROSCOPIC (NOT AT [facility]) - Abnormal; Notable for the following:    Color, Urine AMBER (*)    APPearance CLOUDY (*)    Bilirubin Urine SMALL (*)    Ketones, ur 15 (*)    All other components within normal limits  CK - Abnormal; Notable for the following:    Total CK 812 (*)    All other components within normal limits    Procedures Procedures (including critical care time)  Medications Ordered in ED Medications  sodium chloride 0.9 % bolus 2,000 mL (2,000 mLs Intravenous New Bag/Given 05/16/16 2351)     Initial Impression / Assessment and Plan / ED Course  I have reviewed the triage vital signs and the nursing notes.  Pertinent labs & imaging results that were available during my care of the patient were reviewed by me and considered in my medical decision making (see chart for details).  Clinical Course  Value Comment By Time  CK Total: (!) 812 Elevated. Jarrett Soho Ahmiya Abee, PA-C 09/22 2357  Creatinine: (!) 2.30 Significantly elevated from baseline which has always been less than 1 Tkai Serfass, PA-C 09/22 2357  Hgb urine dipstick: NEGATIVE No hemoglobin noted on dipstick. Jarrett Soho Ellenie Salome, PA-C 09/22 2357  Platelets: (!) 120 Slightly low Abigail Butts, PA-C 09/22 2357   Pt with rhabdo and AKI.  He was given fluids in the ED. Discussed with pt my concerns for progressive renal failure and the benefits of admission.  Pt is adamant that he will not stay for admission.  I discussed other risks including death and disability and potential need for long-term dialysis without further treatment. Patient states understanding. He has capacity to make this decision. His significant other is in the room with him and has encouraged him to stay. Patient will not change his mind. I have offered  alternatives of treatment including fluids here in the emergency department and recheck of his labs here in the emergency department tomorrow. He agrees to this plan.  He understands that his discharge is [place].  Final Clinical Impressions(s) / ED Diagnoses   Final diagnoses:  AKI (acute kidney injury) [facility])  Non-traumatic rhabdomyolysis    New Prescriptions Current Discharge Medication List       Abigail Butts, PA-C 05/17/16 0142    Sherwood Gambler, MD 05/21/16 [phone]

## 2016-05-17 NOTE — ED Notes (Signed)
Pt departed in NAD, refused use of wheelchair. Pt and SO stated they had no way home. Charge RN refused use of cab voucher. Pt was given [employer] bus pass.

## 2016-05-17 NOTE — Discharge Instructions (Signed)
1. Medications: usual home medications 2. Treatment: rest, drink plenty of fluids,  3. Follow Up: Please followup with the [facility] or this Emergency Department for repeat labs later today. Please return to the ER sooner for weakness, shortness of breath, worsening cramps, darkening of your urine, decreased urine or other concerns

## 2016-05-17 NOTE — ED Notes (Signed)
Pt strongly refused rectal temperature earlier during initial assessment. PA notified.

## 2016-05-18 ENCOUNTER — Encounter (HOSPITAL_COMMUNITY): Payer: Self-pay | Admitting: *Deleted

## 2016-05-18 ENCOUNTER — Emergency Department (HOSPITAL_COMMUNITY)
Admission: EM | Admit: 2016-05-18 | Discharge: 2016-05-18 | Disposition: A | Discharge status: Home / Self Care | Payer: Medicaid Other | Attending: Emergency Medicine | Admitting: Emergency Medicine

## 2016-05-18 DIAGNOSIS — I10 Essential (primary) hypertension: Secondary | ICD-10-CM | POA: Diagnosis not present

## 2016-05-18 DIAGNOSIS — F17200 Nicotine dependence, unspecified, uncomplicated: Secondary | ICD-10-CM | POA: Insufficient documentation

## 2016-05-18 DIAGNOSIS — R7989 Other specified abnormal findings of blood chemistry: Secondary | ICD-10-CM | POA: Diagnosis not present

## 2016-05-18 DIAGNOSIS — R799 Abnormal finding of blood chemistry, unspecified: Secondary | ICD-10-CM | POA: Diagnosis present

## 2016-05-18 DIAGNOSIS — R748 Abnormal levels of other serum enzymes: Secondary | ICD-10-CM

## 2016-05-18 LAB — CBC
HCT: 42.8 % (ref 39.0–52.0)
Hemoglobin: 14.9 g/dL (ref 13.0–17.0)
MCH: 32.5 pg (ref 26.0–34.0)
MCHC: 34.8 g/dL (ref 30.0–36.0)
MCV: 93.2 fL (ref 78.0–100.0)
PLATELETS: 105 10*3/uL — AB (ref 150–400)
RBC: 4.59 MIL/uL (ref 4.22–5.81)
RDW: 12.3 % (ref 11.5–15.5)
WBC: 4.8 10*3/uL (ref 4.0–10.5)

## 2016-05-18 LAB — BASIC METABOLIC PANEL
ANION GAP: 7 (ref 5–15)
BUN: 13 mg/dL (ref 6–20)
CALCIUM: 9 mg/dL (ref 8.9–10.3)
CHLORIDE: 105 mmol/L (ref 101–111)
CO2: 24 mmol/L (ref 22–32)
Creatinine, Ser: 1.19 mg/dL (ref 0.61–1.24)
GFR calc non Af Amer: >60 mL/min (ref >60)
Glucose, Bld: 100 mg/dL — ABNORMAL HIGH (ref 65–99)
POTASSIUM: 4.5 mmol/L (ref 3.5–5.1)
Sodium: 136 mmol/L (ref 135–145)

## 2016-05-18 LAB — CK: Total CK: 549 U/L — ABNORMAL HIGH (ref 49–397)

## 2016-05-18 NOTE — ED Provider Notes (Signed)
[place] DEPT Provider Note   CSN: [id number] Arrival date & time: 05/18/16  1300     History   Chief Complaint Chief Complaint  Patient presents with  . Abnormal Lab    HPI Derrick Callahan is a 45 y.o. male who presents for recheck of labs. Past medical history significant for AML and hypertension. He was seen here 2 days ago for acute exposure and had SCr of 2.30 and CK of 819. He was treated with 2L IVF but decided to leave AMA. At that time he was having cramps in his thighs. Today he states he feels great. His symptoms have resolved and came to have his labs rechecked.   HPI  Past Medical History:  Diagnosis Date  . 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 Hypertension     Patient Active Problem List   Diagnosis Date Noted  . Pancytopenia due to antineoplastic chemotherapy ([place]) 10/04/2014  . Problem related to psychosocial circumstances 10/04/2014  . Thrombocytopenia due to drugs 07/27/2014  . Leukopenia due to antineoplastic chemotherapy 07/27/2014  . Fungal pneumonia 07/27/2014  . 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 Elevated liver enzymes 07/27/2014  . Tobacco dependency 07/27/2014  . AML (acute myelogenous leukemia) ([place]) 07/26/2014    Past Surgical History:  Procedure Laterality Date  . PORTACATH PLACEMENT         Home Medications    Prior to Admission medications   Not on File    Family History Family History  Problem Relation Age of Onset  . Cancer Maternal Grandmother     unknown ca    Social History Social History  Substance Use Topics  . Smoking status: Current Every Day Smoker    Packs/day: 0.20    Years: 24.00  . Smokeless tobacco: Never Used  . Alcohol use 0.6 oz/week    1 Shots of liquor per week     Allergies   Review of patient's allergies indicates no known allergies.   Review of Systems Review of Systems  Genitourinary:       No dark urine  Musculoskeletal: Negative for myalgias.  All other  systems reviewed and are negative.    Physical Exam Updated Vital Signs BP 112/65 (BP Location: Left Arm)   Pulse (!) 50   Temp 98.7 F (37.1 C) (Oral)   Resp 16   SpO2 99%   Physical Exam  Constitutional: He is oriented to person, place, and time. He appears well-developed and well-nourished. No distress.  HENT:  Head: Normocephalic and atraumatic.  Eyes: Conjunctivae are normal. Pupils are equal, round, and reactive to light. Right eye exhibits no discharge. Left eye exhibits no discharge. No scleral icterus.  Neck: Normal range of motion. Neck supple.  Cardiovascular: Normal rate and regular rhythm.   No murmur heard. Pulmonary/Chest: Effort normal and breath sounds normal. No respiratory distress.  Abdominal: Soft. He exhibits no distension. There is no tenderness.  Musculoskeletal: He exhibits no edema.  Neurological: He is alert and oriented to person, place, and time.  Skin: Skin is warm and dry.  Psychiatric: He has a normal mood and affect.  Nursing note and vitals reviewed.    ED Treatments / Results  Labs (all labs ordered are listed, but only abnormal results are displayed) Labs Reviewed  BASIC METABOLIC PANEL - Abnormal; Notable for the following:       Result Value   Glucose, Bld 100 (*)    All other components within normal limits  CK - Abnormal;  Notable for the following:    Total CK 549 (*)    All other components within normal limits  CBC - Abnormal; Notable for the following:    Platelets 105 (*)    All other components within normal limits    EKG  EKG Interpretation None       Radiology No results found.  Procedures Procedures (including critical care time)  Medications Ordered in ED Medications - No data to display   Initial Impression / Assessment and Plan / ED Course  I have reviewed the triage vital signs and the nursing notes.  Pertinent labs & imaging results that were available during my care of the patient were reviewed by  me and considered in my medical decision making (see chart for details).  Clinical Course   45 year old male presents with recheck for labs. He is bradycardic which appears to be his baseline. All other labs are WNL and stable. SCr has trended down from 2.3 to 1.19. CK has trended down from 812 to 549. He is asymptomatic. Will d/c with PCP follow up and advised to continue to drink plenty of fluids. Patient is NAD, non-toxic, with stable VS. Patient is informed of clinical course, understands medical decision making process, and agrees with plan. Opportunity for questions provided and all questions answered. Return precautions given.  Final Clinical Impressions(s) / ED Diagnoses   Final diagnoses:  Elevated CK    New Prescriptions New Prescriptions   No medications on file     Recardo Evangelist, PA-C 05/18/16 [place], MD 05/19/16 [phone]

## 2016-05-18 NOTE — ED Triage Notes (Signed)
[facility] for working in heat and muscle cramps, had elevated CK and Creatinine. Received 2L iv bolus and was told to come back for re-eval of labs. No other complaints.

## 2017-12-08 ENCOUNTER — Other Ambulatory Visit: Payer: Self-pay

## 2017-12-08 ENCOUNTER — Encounter (HOSPITAL_COMMUNITY): Payer: Self-pay

## 2017-12-08 ENCOUNTER — Inpatient Hospital Stay (HOSPITAL_COMMUNITY)
Admission: EM | Admit: 2017-12-08 | Discharge: 2017-12-14 | DRG: 193 | Disposition: A | Discharge status: Home / Self Care | Payer: Medicaid Other | Attending: Internal Medicine | Admitting: Internal Medicine

## 2017-12-08 ENCOUNTER — Emergency Department (HOSPITAL_COMMUNITY): Payer: Medicaid Other

## 2017-12-08 DIAGNOSIS — R5081 Fever presenting with conditions classified elsewhere: Secondary | ICD-10-CM | POA: Diagnosis present

## 2017-12-08 DIAGNOSIS — D701 Agranulocytosis secondary to cancer chemotherapy: Secondary | ICD-10-CM | POA: Diagnosis not present

## 2017-12-08 DIAGNOSIS — D709 Neutropenia, unspecified: Secondary | ICD-10-CM

## 2017-12-08 DIAGNOSIS — T451X5A Adverse effect of antineoplastic and immunosuppressive drugs, initial encounter: Secondary | ICD-10-CM | POA: Diagnosis present

## 2017-12-08 DIAGNOSIS — J181 Lobar pneumonia, unspecified organism: Principal | ICD-10-CM | POA: Diagnosis present

## 2017-12-08 DIAGNOSIS — C9200 Acute myeloblastic leukemia, not having achieved remission: Secondary | ICD-10-CM | POA: Diagnosis present

## 2017-12-08 DIAGNOSIS — F17210 Nicotine dependence, cigarettes, uncomplicated: Secondary | ICD-10-CM | POA: Diagnosis present

## 2017-12-08 DIAGNOSIS — D61810 Antineoplastic chemotherapy induced pancytopenia: Secondary | ICD-10-CM | POA: Diagnosis present

## 2017-12-08 DIAGNOSIS — Z8249 Family history of ischemic heart disease and other diseases of the circulatory system: Secondary | ICD-10-CM

## 2017-12-08 DIAGNOSIS — N179 Acute kidney failure, unspecified: Secondary | ICD-10-CM | POA: Diagnosis present

## 2017-12-08 DIAGNOSIS — I10 Essential (primary) hypertension: Secondary | ICD-10-CM | POA: Diagnosis not present

## 2017-12-08 HISTORY — DX: Unspecified viral hepatitis C without hepatic coma: B19.20

## 2017-12-08 LAB — CBC WITH DIFFERENTIAL/PLATELET
BASOS ABS: 0 10*3/uL (ref 0.0–0.1)
Basophils Relative: 0 %
Eosinophils Absolute: 0.1 10*3/uL (ref 0.0–0.7)
Eosinophils Relative: 5 %
HCT: 28.1 % — ABNORMAL LOW (ref 39.0–52.0)
HEMOGLOBIN: 10 g/dL — AB (ref 13.0–17.0)
LYMPHS ABS: 0.7 10*3/uL (ref 0.7–4.0)
Lymphocytes Relative: 71 %
MCH: 35 pg — ABNORMAL HIGH (ref 26.0–34.0)
MCHC: 35.6 g/dL (ref 30.0–36.0)
MCV: 98.3 fL (ref 78.0–100.0)
MONO ABS: 0 10*3/uL — AB (ref 0.1–1.0)
MONOS PCT: 1 %
NEUTROS ABS: 0.2 10*3/uL — AB (ref 1.7–7.7)
Neutrophils Relative %: 23 %
Platelets: 87 10*3/uL — ABNORMAL LOW (ref 150–400)
RBC: 2.86 MIL/uL — AB (ref 4.22–5.81)
RDW: 15.4 % (ref 11.5–15.5)
WBC: 1 10*3/uL — AB (ref 4.0–10.5)

## 2017-12-08 LAB — URINALYSIS, ROUTINE W REFLEX MICROSCOPIC
Bilirubin Urine: NEGATIVE
GLUCOSE, UA: NEGATIVE mg/dL
Hgb urine dipstick: NEGATIVE
KETONES UR: NEGATIVE mg/dL
LEUKOCYTES UA: NEGATIVE
Nitrite: NEGATIVE
PH: 6 (ref 5.0–8.0)
Protein, ur: NEGATIVE mg/dL
Specific Gravity, Urine: 1.013 (ref 1.005–1.030)

## 2017-12-08 LAB — HEPATIC FUNCTION PANEL
ALBUMIN: 3.6 g/dL (ref 3.5–5.0)
ALK PHOS: 65 U/L (ref 38–126)
ALT: 32 U/L (ref 17–63)
AST: 33 U/L (ref 15–41)
BILIRUBIN TOTAL: 1.1 mg/dL (ref 0.3–1.2)
Bilirubin, Direct: 0.3 mg/dL (ref 0.1–0.5)
Indirect Bilirubin: 0.8 mg/dL (ref 0.3–0.9)
TOTAL PROTEIN: 7.4 g/dL (ref 6.5–8.1)

## 2017-12-08 LAB — BASIC METABOLIC PANEL
Anion gap: 9 (ref 5–15)
BUN: 12 mg/dL (ref 6–20)
CALCIUM: 8.8 mg/dL — AB (ref 8.9–10.3)
CO2: 25 mmol/L (ref 22–32)
CREATININE: 1.17 mg/dL (ref 0.61–1.24)
Chloride: 105 mmol/L (ref 101–111)
GFR calc Af Amer: >60 mL/min (ref >60)
GLUCOSE: 113 mg/dL — AB (ref 65–99)
POTASSIUM: 3.7 mmol/L (ref 3.5–5.1)
SODIUM: 139 mmol/L (ref 135–145)

## 2017-12-08 LAB — INFLUENZA PANEL BY PCR (TYPE A & B)
Influenza A By PCR: NEGATIVE
Influenza B By PCR: NEGATIVE

## 2017-12-08 LAB — I-STAT CG4 LACTIC ACID, ED: LACTIC ACID, VENOUS: 0.54 mmol/L (ref 0.5–1.9)

## 2017-12-08 LAB — GROUP A STREP BY PCR: GROUP A STREP BY PCR: NOT DETECTED

## 2017-12-08 MED ORDER — SODIUM CHLORIDE 0.9 % IV SOLN
2.0000 g | Freq: Three times a day (TID) | INTRAVENOUS | Status: DC
  Administered 2017-12-09 – 2017-12-14 (×17): 2 g via INTRAVENOUS
  Filled 2017-12-08 (×18): qty 2

## 2017-12-08 MED ORDER — ACYCLOVIR 400 MG PO TABS
400.0000 mg | ORAL_TABLET | Freq: Two times a day (BID) | ORAL | Status: DC
  Administered 2017-12-09 – 2017-12-14 (×11): 400 mg via ORAL
  Filled 2017-12-08 (×11): qty 1

## 2017-12-08 MED ORDER — VORICONAZOLE 200 MG IV SOLR
200.0000 mg | Freq: Two times a day (BID) | INTRAVENOUS | Status: DC
  Administered 2017-12-08: 200 mg via INTRAVENOUS
  Filled 2017-12-08: qty 200

## 2017-12-08 MED ORDER — DEXTROSE 5 % IV SOLN
400.0000 mg | Freq: Once | INTRAVENOUS | Status: AC
  Administered 2017-12-08: 400 mg via INTRAVENOUS
  Filled 2017-12-08: qty 8

## 2017-12-08 MED ORDER — SENNOSIDES-DOCUSATE SODIUM 8.6-50 MG PO TABS: 1.0000 | ORAL_TABLET | Freq: Every evening | ORAL | Status: DC | PRN

## 2017-12-08 MED ORDER — AMLODIPINE BESYLATE 5 MG PO TABS
10.0000 mg | ORAL_TABLET | Freq: Every day | ORAL | Status: DC
  Administered 2017-12-09 – 2017-12-14 (×6): 10 mg via ORAL
  Filled 2017-12-08 (×6): qty 2

## 2017-12-08 MED ORDER — VORICONAZOLE 200 MG PO TABS
200.0000 mg | ORAL_TABLET | Freq: Two times a day (BID) | ORAL | Status: DC
  Administered 2017-12-09 – 2017-12-14 (×11): 200 mg via ORAL
  Filled 2017-12-08 (×11): qty 1

## 2017-12-08 MED ORDER — ZOLPIDEM TARTRATE 5 MG PO TABS: 5.0000 mg | ORAL_TABLET | Freq: Every evening | ORAL | Status: DC | PRN

## 2017-12-08 MED ORDER — ONDANSETRON HCL 4 MG PO TABS: 4.0000 mg | ORAL_TABLET | Freq: Four times a day (QID) | ORAL | Status: DC | PRN

## 2017-12-08 MED ORDER — TRAMADOL HCL 50 MG PO TABS: 50.0000 mg | ORAL_TABLET | Freq: Four times a day (QID) | ORAL | Status: DC | PRN

## 2017-12-08 MED ORDER — ONDANSETRON HCL 4 MG/2ML IJ SOLN: 4.0000 mg | Freq: Four times a day (QID) | INTRAMUSCULAR | Status: DC | PRN

## 2017-12-08 MED ORDER — ACETAMINOPHEN 325 MG PO TABS: 650.0000 mg | ORAL_TABLET | Freq: Four times a day (QID) | ORAL | Status: DC | PRN

## 2017-12-08 MED ORDER — SODIUM CHLORIDE 0.9 % IV SOLN
2.0000 g | Freq: Once | INTRAVENOUS | Status: AC
  Administered 2017-12-09: 2 g via INTRAVENOUS
  Filled 2017-12-08: qty 2

## 2017-12-08 MED ORDER — ACETAMINOPHEN 650 MG RE SUPP: 650.0000 mg | Freq: Four times a day (QID) | RECTAL | Status: DC | PRN

## 2017-12-08 MED ORDER — LORAZEPAM 0.5 MG PO TABS: 0.5000 mg | ORAL_TABLET | Freq: Four times a day (QID) | ORAL | Status: DC | PRN

## 2017-12-08 MED ORDER — SODIUM CHLORIDE 0.9 % IV SOLN
Freq: Once | INTRAVENOUS | Status: DC
  Administered 2017-12-08: 22:00:00 via INTRAVENOUS

## 2017-12-08 NOTE — ED Notes (Signed)
Bed: WA10 Expected date:  Expected time:  Means of arrival:  Comments: Hold for triage 2 

## 2017-12-08 NOTE — ED Triage Notes (Signed)
Patient c/o sore throat x 9 days and productive cough with yellow sputum x 6 days. Patient is currently receiving chemo treatments, the last a month ago.

## 2017-12-08 NOTE — ED Notes (Signed)
[employer] On[facility].Dr Linus Orn for PA Legrand Como @2008 

## 2017-12-08 NOTE — ED Provider Notes (Signed)
[place] DEPT Provider Note   CSN: [id number] Arrival date & time: 12/08/17  1516     History   Chief Complaint Chief Complaint  Patient presents with  . Sore Throat  . Cough    HPI Derrick Callahan is a 46 y.o. male with a history of AML currently followed by Dr. Linus Orn and receiving chemotherapy (last received 11/07/17) who presents the emergency department today for cough and sore throat times 9 days.  Patient states that 9 days ago he started developing sore throat with associated dysphasia.  Since that time he has had nasal congestion, rhinorrhea, chills, body aches, productive cough with yellow sputum.  He called his oncologists NP who had labs drawn on 4/5 that showed that he was neutropenic.  She is concerned for a neutropenic fever and had him present to the emergency department for further evaluation.  Patient has not been taking anything for his symptoms.  He denies any sick contacts.  He states he is without preceding illness.  He denies any fever, headache, visual changes, neck pain, neck stiffness, inability to control secretions, chest pain, shortness of breath, hemoptysis, abdominal pain, nausea/vomiting/diarrhea, flank pain, urinary urgency, hematuria, dysuria.  He notes that he has had some increased urinary frequency recently.  Patient states he has normal bowel movements.  No melena or hematochezia.  No rash or lower leg swelling.  HPI  Past Medical History:  Diagnosis Date  . AML (acute myelogenous leukemia) ([place]) 07/26/2014  . Hepatitis C   . Hypertension     Patient Active Problem List   Diagnosis Date Noted  . Pancytopenia due to antineoplastic chemotherapy ([place]) 10/04/2014  . Problem related to psychosocial circumstances 10/04/2014  . Thrombocytopenia due to drugs 07/27/2014  . Leukopenia due to antineoplastic chemotherapy ([place]) 07/27/2014  . Fungal pneumonia 07/27/2014  . 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 Elevated  liver enzymes 07/27/2014  . Tobacco dependency 07/27/2014  . AML (acute myelogenous leukemia) ([place]) 07/26/2014    Past Surgical History:  Procedure Laterality Date  . BONE MARROW ASPIRATION    . PORTACATH PLACEMENT          Home Medications    Prior to Admission medications   Medication Sig Start Date End Date Taking? Authorizing Provider  acyclovir (ZOVIRAX) 400 MG tablet Take 400 mg by mouth 2 (two) times daily as needed (neutropenic).    Yes [provider]  amLODipine (NORVASC) 10 MG tablet Take 10 mg by mouth daily.  11/06/17  Yes [provider]  voriconazole (VFEND) 200 MG tablet Take 200 mg by mouth every 12 (twelve) hours.  09/29/17  Yes [provider]  traMADol (ULTRAM) 50 MG tablet Take 50 mg by mouth every 6 (six) hours as needed for moderate pain.  10/29/17   [provider]    Family History Family History  Problem Relation Age of Onset  . Cancer Maternal Grandmother        unknown ca  . Hypertension Father   . Aneurysm Father     Social History Social History   Tobacco Use  . Smoking status: Current Every Day Smoker    Packs/day: 0.50    Years: 24.00    Pack years: 12.00    Types: Cigarettes  . Smokeless tobacco: Never Used  Substance Use Topics  . Alcohol use: Yes    Alcohol/week: 0.6 oz    Types: 1 Shots of liquor per week    Comment: Occasionally  . Drug  use: Yes    Types: Marijuana    Comment: patient states daily use     Allergies   Patient has no known allergies.   Review of Systems Review of Systems  All other systems reviewed and are negative.    Physical Exam Updated Vital Signs BP 116/72 (BP Location: Right Arm)   Pulse 87   Temp 99.2 F (37.3 C) (Oral)   Resp 16   Ht 5' 11.75" (1.822 m)   Wt 85.7 kg (189 lb)   SpO2 98%   BMI 25.81 kg/m   Physical Exam  Constitutional: He appears well-developed and well-nourished.  HENT:  Head: Normocephalic and atraumatic.  Right Ear: External  ear normal.  Left Ear: External ear normal.  Nose: Nose normal.  Mouth/Throat: Uvula is midline, oropharynx is clear and moist and mucous membranes are normal. No tonsillar exudate.  The patient has normal phonation and is in control of secretions. No stridor.  Midline uvula without edema. Soft palate rises symmetrically. 1+ bilateral tonsils with erythema without exudates. No PTA. Tongue protrusion is normal. No trismus. No creptius on neck palpation and patient has good dentition. No gingival erythema or fluctuance noted.  No floor edema mucus membranes moist.   Eyes: Pupils are equal, round, and reactive to light. Right eye exhibits no discharge. Left eye exhibits no discharge. No scleral icterus.  Neck: Trachea normal. Neck supple. No spinous process tenderness present. No neck rigidity. Normal range of motion present.  Cardiovascular: Normal rate, regular rhythm and intact distal pulses.  No murmur heard. Pulses:      Radial pulses are 2+ on the right side, and 2+ on the left side.       Dorsalis pedis pulses are 2+ on the right side, and 2+ on the left side.       Posterior tibial pulses are 2+ on the right side, and 2+ on the left side.  No lower extremity swelling or edema. Calves symmetric in size bilaterally.  Pulmonary/Chest: Effort normal and breath sounds normal. He exhibits no tenderness.  No increased work of breathing. No accessory muscle use. Patient is sitting upright, speaking in full sentences without difficulty   Abdominal: Soft. Bowel sounds are normal. There is no tenderness. There is no rebound and no guarding.  Musculoskeletal: He exhibits no edema.  Neurological: He is alert.  Skin: Skin is warm and dry. No rash noted. He is not diaphoretic.  Psychiatric: He has a normal mood and affect.  Nursing note and vitals reviewed.    ED Treatments / Results  Labs (all labs ordered are listed, but only abnormal results are displayed) Labs Reviewed  BASIC METABOLIC PANEL -  Abnormal; Notable for the following components:      Result Value   Glucose, Bld 113 (*)    Calcium 8.8 (*)    All other components within normal limits  CBC WITH DIFFERENTIAL/PLATELET - Abnormal; Notable for the following components:   WBC 1.0 (*)    RBC 2.86 (*)    Hemoglobin 10.0 (*)    HCT 28.1 (*)    MCH 35.0 (*)    Platelets 87 (*)    Neutro Abs 0.2 (*)    Monocytes Absolute 0.0 (*)    All other components within normal limits  GROUP A STREP BY PCR  CULTURE, BLOOD (ROUTINE X 2)  CULTURE, BLOOD (ROUTINE X 2)  CULTURE, GROUP A STREP ([place])  RESPIRATORY PANEL BY PCR  URINALYSIS, ROUTINE W REFLEX MICROSCOPIC  INFLUENZA  PANEL BY PCR (TYPE A & B)  HEPATIC FUNCTION PANEL  MONONUCLEOSIS SCREEN  HIV ANTIBODY (ROUTINE TESTING)  I-STAT CG4 LACTIC ACID, ED    EKG None  Radiology Dg Chest 2 View  Result Date: 12/08/2017 CLINICAL DATA:  Cough.  History of acute myelogenous leukemia EXAM: CHEST - 2 VIEW COMPARISON:  None. FINDINGS: Port-A-Cath tip is in the superior vena cava near the cavoatrial junction. No pneumothorax. Lungs are clear. Heart size and pulmonary vascularity are normal. No adenopathy. No bone lesions. IMPRESSION: Port-A-Cath tip immediately superior to cavoatrial junction. No pneumothorax. Lungs clear. No evident adenopathy. Electronically Signed   By: Lowella Grip III M.D.   On: 12/08/2017 17:36    Procedures Procedures (including critical care time)  Medications Ordered in ED Medications  ceFEPIme (MAXIPIME) 2 g in sodium chloride 0.9 % 100 mL IVPB (has no administration in time range)  voriconazole (VFEND) 200 mg in sodium chloride 0.9 % 100 mL IVPB (has no administration in time range)  acyclovir (ZOVIRAX) 400 mg in dextrose 5 % 100 mL IVPB (has no administration in time range)  acyclovir (ZOVIRAX) tablet 400 mg (has no administration in time range)  voriconazole (VFEND) tablet 200 mg (has no administration in time range)  ceFEPIme (MAXIPIME) 2 g in  sodium chloride 0.9 % 100 mL IVPB (has no administration in time range)  amLODipine (NORVASC) tablet 10 mg (has no administration in time range)  traMADol (ULTRAM) tablet 50-100 mg (has no administration in time range)  acetaminophen (TYLENOL) tablet 650 mg (has no administration in time range)    Or  acetaminophen (TYLENOL) suppository 650 mg (has no administration in time range)  senna-docusate (Senokot-S) tablet 1 tablet (has no administration in time range)  ondansetron (ZOFRAN) tablet 4 mg (has no administration in time range)    Or  ondansetron (ZOFRAN) injection 4 mg (has no administration in time range)     Initial Impression / Assessment and Plan / ED Course  I have reviewed the triage vital signs and the nursing notes.  Pertinent labs & imaging results that were available during my care of the patient were reviewed by me and considered in my medical decision making (see chart for details).     47 year old male with a history of AML currently followed by [facility], Dr. Linus Orn, and receiving chemotherapy with the last infusion on 11/07/2017 presenting to the emergency department today for 9-day history of nasal congestion, rhinorrhea, chills, body aches, sore throat with associated dysphasia, productive cough with yellow sputum. He called his oncologists NP who had labs drawn on 4/5 that showed that he was neutropenic.  She is concerned for a neutropenic fever and had him present to the emergency department for further evaluation.  On presentation the patient is without fever.  Temperature 99.2 taken orally.  No antipyretics prior to arrival.  Vital signs otherwise reassuring.  No tachycardia, tachypnea, hypoxia or hypotension.  Patient appears in no acute distress.  Patient's HEENT exam reassuring.  No evidence of acute otitis media.  No mastoid tenderness palpation, erythema or edema.  No meningeal signs.  No sinus tenderness palpation.  No evidence of PTA.  Patient in control of his  secretions.  He does have 1+ bilateral tonsillar erythema without exudate.  Will send strep test as well as mono test.  Patients lungs are clear to auscultation bilaterally.  Given concern for neutropenic fever will obtain viral respiratory panel, influenza, chest x-ray, blood cultures, lactic acid as well as basic blood  work.  Patient denies any urinary symptoms, abdominal pain, nausea/vomiting/diarrhea.  Strep test negative.  Patient's urinalysis without evidence of UTI.  Lactic acid within normal limits.  Blood cultures pending.  No significant electrolyte derangements.  Mild elevation of glucose at 113 without evidence of DKA.  No anion gap acidosis.  BUN and creatinine within normal limits.  No evidence of acute kidney injury.  Patient with pancytopenia.  White blood cell count 1.0.  Hemoglobin 10.0.  Hematocrit 28.1.  Platelet count 87.  Patient does have a decreased absolute neutrophil of 0.2.  Neutrophil percentage at 99.  Spoke with Dr. Lebron Conners of oncology who recommended starting the patient on cefepime and having the patient transferred to [facility].  Will contact their oncology team.  Spoke with Dr. Joan Mayans of Dr. Linus Orn oncology team who recommended patient be admitted to [facility] service overnight until a bed becomes available tomorrow. Plan for transfer team to call hospitalist when bed on Hem/Onc service B becomes available tomorrow under Dr. Guillermina [place]. Recommended patient be started on cefepime 2 g every 6 hours, voriconazole 200 mg twice daily and acyclovir 100 mg twice daily.  Will consult hospitalist for admission.  Dr. Myna Hidalgo to admit the patient.  Secretary has called the pow line and discussed with the transportation that he will be on the MedSurg floor and they will call directly when a bed becomes available tomorrow.   Final Clinical Impressions(s) / ED Diagnoses   Final diagnoses:  Neutropenic fever [facility])    ED Discharge Orders    None       Lorelle Gibbs 12/08/17 2154    Gareth Morgan, MD 12/09/17 1406

## 2017-12-08 NOTE — H&P (Signed)
History and Physical    Derrick Callahan BHA:[id number] DOB: 1970/11/26 DOA: 12/08/2017  PCP: Patient, No Pcp Per   Patient coming from: Home  Chief Complaint: Chills, cough, sore throat   HPI: Derrick Callahan is a 47 y.o. male with medical history significant for acute myeloid leukemia, hepatitis C, and hypertension, now presenting to the emergency department for evaluation of sore throat, chills, and productive cough for the past 9 days.  Patient follows with oncology at [facility] and reports that his last chemotherapy infusion was approximately 1 month ago.  For the past 9 days, he has had intermittent chills, sore throat, and cough productive of thick yellow sputum.  Denies abdominal pain, vomiting, diarrhea, neck stiffness, headache, or rash or wound.  Has not checked his temperature.  ED Course: Upon arrival to the ED, patient is found to be afebrile, saturating well on room air, and with vitals otherwise normal.  Chemistry panel is unremarkable and CBC is notable for a leukopenia with WBC 1000, normocytic anemia with hemoglobin 10.0, and thrombocytopenia with platelets 87,000.  Lactic acid is reassuring at 0.5 and urinalysis is unremarkable.  Chest x-ray is clear.  Blood cultures were collected and the patient's oncologist at [facility]  was contacted by the ED provider.  Oncology at [facility] recommends admission, noting that they do not have oncology bed available currently, and recommend admission to [facility] with empiric cefepime, voriconazole, and acyclovir, with plan for transfer to [facility] when a bed is available, anticipated for tomorrow.  Patient was treated with empiric cefepime, voriconazole, and acyclovir in the ED.  He remains hemodynamically stable, in no apparent respiratory distress, and will be admitted to the medical-surgical unit with neutropenia and chills at home concerning for fever.  Review of Systems:  All other systems reviewed and apart from HPI, are  negative.  Past Medical History:  Diagnosis Date  . AML (acute myelogenous leukemia) ([place]) 07/26/2014  . Hepatitis C   . Hypertension     Past Surgical History:  Procedure Laterality Date  . BONE MARROW ASPIRATION    . PORTACATH PLACEMENT       reports that he has been smoking cigarettes.  He has a 12.00 pack-year smoking history. He has never used smokeless tobacco. He reports that he drinks about 0.6 oz of alcohol per week. He reports that he has current or past drug history. Drug: Marijuana.  No Known Allergies  Family History  Problem Relation Age of Onset  . Cancer Maternal Grandmother        unknown ca  . Hypertension Father   . Aneurysm Father      Prior to Admission medications   Medication Sig Start Date End Date Taking? Authorizing Provider  acyclovir (ZOVIRAX) 400 MG tablet Take 400 mg by mouth 2 (two) times daily as needed (neutropenic).    Yes [provider]  amLODipine (NORVASC) 10 MG tablet Take 10 mg by mouth daily.  11/06/17  Yes [provider]  voriconazole (VFEND) 200 MG tablet Take 200 mg by mouth every 12 (twelve) hours.  09/29/17  Yes [provider]  traMADol (ULTRAM) 50 MG tablet Take 50 mg by mouth every 6 (six) hours as needed for moderate pain.  10/29/17   [provider]    Physical Exam: Vitals:   12/08/17 1522 12/08/17 1747 12/08/17 1839 12/08/17 2100  BP: 116/72 120/74 116/76 123/83  Pulse: 87 80 84 86  Resp: 16 16 16 18   Temp: 99.2 F (  37.3 C)   98 F (36.7 C)  TempSrc: Oral   Oral  SpO2: 98% 98% 97% 99%  Weight: 85.7 kg (189 lb)     Height: 5' 11.75" (1.822 m)         Constitutional: NAD, calm  Eyes: PERTLA, lids and conjunctivae normal ENMT: Mucous membranes are moist. Posterior pharynx clear of any exudate or lesions.   Neck: normal, supple, no masses, no thyromegaly Respiratory: clear to auscultation bilaterally, no wheezing, no crackles. Normal respiratory effort.   Cardiovascular: S1 & S2  heard, regular rate and rhythm. No extremity edema.  No significant JVD. Abdomen: No distension, no tenderness, soft. Bowel sounds active. Musculoskeletal: no clubbing / cyanosis. No joint deformity upper and lower extremities.  Skin: no significant rashes, lesions, ulcers. Warm, dry, well-perfused. Neurologic: CN 2-12 grossly intact. Sensation intact. Strength 5/5 in all 4 limbs.  Psychiatric: Alert and oriented x 3. Calm. Cooperative.     Labs on Admission: I have personally reviewed following labs and imaging studies  CBC: Recent Labs  Lab 12/08/17 1710  WBC 1.0*  NEUTROABS 0.2*  HGB 10.0*  HCT 28.1*  MCV 98.3  PLT 87*   Basic Metabolic Panel: Recent Labs  Lab 12/08/17 1710  NA 139  K 3.7  CL 105  CO2 25  GLUCOSE 113*  BUN 12  CREATININE 1.17  CALCIUM 8.8*   GFR: Estimated Creatinine Clearance: 83.2 mL/min (by C-G formula based on SCr of 1.17 mg/dL). Liver Function Tests: Recent Labs  Lab 12/08/17 1902  AST 33  ALT 32  ALKPHOS 65  BILITOT 1.1  PROT 7.4  ALBUMIN 3.6   No results for input(s): LIPASE, AMYLASE in the last 168 hours. No results for input(s): AMMONIA in the last 168 hours. Coagulation Profile: No results for input(s): INR, PROTIME in the last 168 hours. Cardiac Enzymes: No results for input(s): CKTOTAL, CKMB, CKMBINDEX, TROPONINI in the last 168 hours. BNP (last 3 results) No results for input(s): PROBNP in the last 8760 hours. HbA1C: No results for input(s): HGBA1C in the last 72 hours. CBG: No results for input(s): GLUCAP in the last 168 hours. Lipid Profile: No results for input(s): CHOL, HDL, LDLCALC, TRIG, CHOLHDL, LDLDIRECT in the last 72 hours. Thyroid Function Tests: No results for input(s): TSH, T4TOTAL, FREET4, T3FREE, THYROIDAB in the last 72 hours. Anemia Panel: No results for input(s): VITAMINB12, FOLATE, FERRITIN, TIBC, IRON, RETICCTPCT in the last 72 hours. Urine analysis:    Component Value Date/Time   COLORURINE  YELLOW 12/08/2017 1758   APPEARANCEUR CLEAR 12/08/2017 1758   LABSPEC 1.013 12/08/2017 1758   PHURINE 6.0 12/08/2017 1758   GLUCOSEU NEGATIVE 12/08/2017 1758   HGBUR NEGATIVE 12/08/2017 1758   BILIRUBINUR NEGATIVE 12/08/2017 1758   KETONESUR NEGATIVE 12/08/2017 1758   PROTEINUR NEGATIVE 12/08/2017 1758   NITRITE NEGATIVE 12/08/2017 1758   LEUKOCYTESUR NEGATIVE 12/08/2017 1758   Sepsis Labs: @LABRCNTIP (procalcitonin:4,lacticidven:4) ) Recent Results (from the past 240 hour(s))  Group A Strep by PCR     Status: None   Collection Time: 12/08/17  5:10 PM  Result Value Ref Range Status   Group A Strep by PCR NOT DETECTED NOT DETECTED Final    Comment: Performed at [facility], [place] [address], [place], [place] [address]     Radiological Exams on Admission: Dg Chest 2 View  Result Date: 12/08/2017 CLINICAL DATA:  Cough.  History of acute myelogenous leukemia EXAM: CHEST - 2 VIEW COMPARISON:  None. FINDINGS: Port-A-Cath tip is in  the superior vena cava near the cavoatrial junction. No pneumothorax. Lungs are clear. Heart size and pulmonary vascularity are normal. No adenopathy. No bone lesions. IMPRESSION: Port-A-Cath tip immediately superior to cavoatrial junction. No pneumothorax. Lungs clear. No evident adenopathy. Electronically Signed   By: Lowella Grip III M.D.   On: 12/08/2017 17:36    EKG: Not performed.   Assessment/Plan   1. Neutropenic fever  - Presents with 9 days of malaise, sore throat, productive cough, and subjective fevers/chills  - He is not febrile in ED  - ANC is 230  - CXR clear, UA unremarkable, rapid strep neg, flu PCR negative, abd exam benign, no wound, no meningismus  - ED physician discussed case with the patient's oncologist at [facility] and admission was recommended; there were no beds available at [facility] at the time and admission to [facility] was recommended with transfer team at [facility] to call once bed available there (anticipated for 4/17) - Blood  cultures collected in ED  - Per recommendation of patient's oncologist, will continue cefepime, voriconazole, and acyclovir  - Follow cultures and clinical course, maintain protective precautions, and repeat CBC in am    2. AML  - Follows with oncology at [facility]  - Oncologist aware of admission and plan is for transfer to [facility] once bed available    3. Hypertension  - BP at goal, continue Norvasc    4. Pancytopenia - WBC is 1000 on admission with Hgb 10.0 and platelets 87k  - No bleeding, likely secondary to AML and chemotherapy  - Repeat CBC in am     DVT prophylaxis: SCD's  Code Status: Full  Family Communication: Discussed with patient Consults called: Oncology at [facility]  Admission status: Inpatient    Vianne Bulls, MD [facility] Pager [phone]  If 7PM-7AM, please contact night-coverage [email or web address] Password [facility]  12/08/2017, 9:53 PM

## 2017-12-08 NOTE — Progress Notes (Signed)
Pt on telephone with his mother when I went in the room. He was upset and telling her that he does not want to be admitted to this hospital and that he wants to wait and go to his doctor tomorrow. I introduced myself and told him I needed to do nursing admission history. Pt said ok and started answering questions. He then stopped me and said that he is being honest. He said he is very angry right now and he does not know if he is even answering the questions truthfully. He said he did not want to answer any more questions on the nursing admission history. Unable to complete nursing admission hx. Lucius Conn BSN, RN-BC Admissions RN 12/08/2017 9:29 PM

## 2017-12-08 NOTE — Progress Notes (Signed)
Pharmacy Note   A consult was received from an ED physician for cefepime per pharmacy dosing.    The patient's profile has been reviewed for ht/wt/allergies/indication/available labs.     A one time order has been placed for cefepime 2 gr IV x 1.  Further antibiotics/pharmacy consults should be ordered by admitting physician if indicated.                       Thank you,   Royetta Asal, PharmD, BCPS Pager [phone] 12/08/2017 7:55 PM

## 2017-12-09 ENCOUNTER — Inpatient Hospital Stay (HOSPITAL_COMMUNITY): Payer: Medicaid Other

## 2017-12-09 ENCOUNTER — Encounter (HOSPITAL_COMMUNITY): Payer: Self-pay | Admitting: Radiology

## 2017-12-09 DIAGNOSIS — D709 Neutropenia, unspecified: Secondary | ICD-10-CM

## 2017-12-09 DIAGNOSIS — R5081 Fever presenting with conditions classified elsewhere: Secondary | ICD-10-CM

## 2017-12-09 LAB — CBC WITH DIFFERENTIAL/PLATELET
Basophils Absolute: 0 10*3/uL (ref 0.0–0.1)
Basophils Relative: 0 %
EOS ABS: 0 10*3/uL (ref 0.0–0.7)
Eosinophils Relative: 3 %
HCT: 26.8 % — ABNORMAL LOW (ref 39.0–52.0)
Hemoglobin: 9.6 g/dL — ABNORMAL LOW (ref 13.0–17.0)
LYMPHS ABS: 0.8 10*3/uL (ref 0.7–4.0)
Lymphocytes Relative: 75 %
MCH: 35.3 pg — AB (ref 26.0–34.0)
MCHC: 35.8 g/dL (ref 30.0–36.0)
MCV: 98.5 fL (ref 78.0–100.0)
MONO ABS: 0 10*3/uL — AB (ref 0.1–1.0)
Monocytes Relative: 1 %
NEUTROS PCT: 21 %
Neutro Abs: 0.2 10*3/uL — ABNORMAL LOW (ref 1.7–7.7)
PLATELETS: 86 10*3/uL — AB (ref 150–400)
RBC: 2.72 MIL/uL — AB (ref 4.22–5.81)
RDW: 15.4 % (ref 11.5–15.5)
WBC: 1 10*3/uL — AB (ref 4.0–10.5)

## 2017-12-09 LAB — BASIC METABOLIC PANEL
Anion gap: 8 (ref 5–15)
BUN: 11 mg/dL (ref 6–20)
CALCIUM: 8.7 mg/dL — AB (ref 8.9–10.3)
CO2: 27 mmol/L (ref 22–32)
Chloride: 104 mmol/L (ref 101–111)
Creatinine, Ser: 1.32 mg/dL — ABNORMAL HIGH (ref 0.61–1.24)
GFR calc Af Amer: >60 mL/min (ref >60)
Glucose, Bld: 133 mg/dL — ABNORMAL HIGH (ref 65–99)
POTASSIUM: 3.6 mmol/L (ref 3.5–5.1)
SODIUM: 139 mmol/L (ref 135–145)

## 2017-12-09 LAB — HIV ANTIBODY (ROUTINE TESTING W REFLEX): HIV Screen 4th Generation wRfx: NONREACTIVE

## 2017-12-09 LAB — RESPIRATORY PANEL BY PCR
ADENOVIRUS-RVPPCR: NOT DETECTED
Bordetella pertussis: NOT DETECTED
CHLAMYDOPHILA PNEUMONIAE-RVPPCR: NOT DETECTED
CORONAVIRUS HKU1-RVPPCR: NOT DETECTED
CORONAVIRUS NL63-RVPPCR: NOT DETECTED
Coronavirus 229E: NOT DETECTED
Coronavirus OC43: NOT DETECTED
Influenza A: NOT DETECTED
Influenza B: NOT DETECTED
MYCOPLASMA PNEUMONIAE-RVPPCR: NOT DETECTED
Metapneumovirus: NOT DETECTED
Parainfluenza Virus 1: NOT DETECTED
Parainfluenza Virus 2: NOT DETECTED
Parainfluenza Virus 3: NOT DETECTED
Parainfluenza Virus 4: NOT DETECTED
Respiratory Syncytial Virus: NOT DETECTED
Rhinovirus / Enterovirus: NOT DETECTED

## 2017-12-09 LAB — MONONUCLEOSIS SCREEN: MONO SCREEN: NEGATIVE

## 2017-12-09 MED ORDER — FLUTICASONE PROPIONATE 50 MCG/ACT NA SUSP
1.0000 | Freq: Every day | NASAL | Status: DC
  Administered 2017-12-09 – 2017-12-14 (×6): 1 via NASAL
  Filled 2017-12-09: qty 16

## 2017-12-09 MED ORDER — GUAIFENESIN-DM 100-10 MG/5ML PO SYRP
10.0000 mL | ORAL_SOLUTION | ORAL | Status: DC | PRN
  Administered 2017-12-09 – 2017-12-12 (×9): 10 mL via ORAL
  Filled 2017-12-09 (×10): qty 10

## 2017-12-09 NOTE — Progress Notes (Signed)
PROGRESS NOTE  Derrick Callahan GYJ:[id number] DOB: Oct 11, 1970 DOA: 12/08/2017 PCP: Patient, No Pcp Per  HPI/Recap of past 24 hours: Derrick Callahan is a 47 y.o. male with medical history significant for acute myeloid leukemia, hepatitis C, and hypertension, presenting to the ED for evaluation of sore throat, chills, and productive cough for the past week PTA.  Patient follows with oncology at [facility] and reports that his last chemotherapy infusion was approximately 1 month ago. In the ED, CBC is notable for a leukopenia with WBC 1000, normocytic anemia with hemoglobin 10.0, and thrombocytopenia with platelets 87,000. Chest x-ray is clear.  UA unremarkable. Oncology at [facility] recommends admission (no oncology bed at their facility) with empiric cefepime, voriconazole, and acyclovir, with plan for transfer to [facility]. Spoke to Oncology on call at [facility] on 12/09/17, suggested pt stay in [place] as currently pt is stable and they wouldn't do anything different over at [facility].  Today pt reported feeling better, still coughing, denies its worsening. Sore throat improving. Denies any fever/chills, headaches, abdominal pain, chest pain, dizziness.   Assessment/Plan: Principal Problem:   Neutropenia with fever (HCC) Active Problems:   AML (acute myelogenous leukemia) (HCC)   Leukopenia due to antineoplastic chemotherapy ([place])   Pancytopenia due to antineoplastic chemotherapy ([place])   Neutropenic fever ([place])   Essential hypertension  Neutropenic fever/Hx of AML on chemo Currently afebrile, neutropenic (ANC 0.2) Unknown etiology: CXR clear, UA unremarkable, rapid strep neg, viral panel negative, abd exam benign, no wound, no meningismus  Blood cultures X 2 pending Spoke to Oncologist on call at [facility], cell phone no is [phone] rec no need to transfer to [facility] as they wouldn't do anything different. Rec Chest CT and continue with empiric AB until cultures are back, and to call if clinical status  worsens Continue cefepime, voriconazole, and acyclovir  Daily CBC  AKI Cr noted to be 1.32 Will follow closely, and may start IVF if worsening Daily BMP  AML  Follows with oncology at [facility]   Hypertension  BP at goal, continue Norvasc    Pancytopenia Likely due to chemo/AML No bleeding Daily CBC     Code Status: Full  Family Communication: None at bedside   Disposition Plan: Once [facility] are negative for 2-3 days   Consultants:  Spoke with [facility] oncologist on call  Procedures:  None  Antimicrobials:  IV Cefepime  IV Acyclovir  PO Voriconazole  DVT prophylaxis:  SCDs   Objective: Vitals:   12/08/17 2100 12/09/17 0122 12/09/17 0550 12/09/17 0959  BP: 123/83 124/76 (!) 111/59 136/85  Pulse: 86 81 73 79  Resp: 18 20 20    Temp: 98 F (36.7 C) 99.2 F (37.3 C) 99 F (37.2 C)   TempSrc: Oral Oral Oral   SpO2: 99% 98% 100%   Weight:      Height:        Intake/Output Summary (Last 24 hours) at 12/09/2017 1306 Last data filed at 12/09/2017 0600 Gross per 24 hour  Intake 220 ml  Output -  Net 220 ml   Filed Weights   12/08/17 1522  Weight: 85.7 kg (189 lb)    Exam:   General:  NAD  Cardiovascular: S1, S2 present  Respiratory: CTAB  Abdomen: Soft, NT, ND, BS present  Musculoskeletal: No pedal edema bilaterally  Skin: Normal  Psychiatry: Normal mood   Data Reviewed: CBC: Recent Labs  Lab 12/08/17 1710 12/09/17 0322  WBC 1.0* 1.0*  NEUTROABS 0.2* 0.2*  HGB 10.0* 9.6*  HCT 28.1* 26.8*  MCV 98.3 98.5  PLT 87* 86*   Basic Metabolic Panel: Recent Labs  Lab 12/08/17 1710 12/09/17 0322  NA 139 139  K 3.7 3.6  CL 105 104  CO2 25 27  GLUCOSE 113* 133*  BUN 12 11  CREATININE 1.17 1.32*  CALCIUM 8.8* 8.7*   GFR: Estimated Creatinine Clearance: 73.7 mL/min (A) (by C-G formula based on SCr of 1.32 mg/dL (H)). Liver Function Tests: Recent Labs  Lab 12/08/17 1902  AST 33  ALT 32  ALKPHOS 65  BILITOT 1.1  PROT 7.4    ALBUMIN 3.6   No results for input(s): LIPASE, AMYLASE in the last 168 hours. No results for input(s): AMMONIA in the last 168 hours. Coagulation Profile: No results for input(s): INR, PROTIME in the last 168 hours. Cardiac Enzymes: No results for input(s): CKTOTAL, CKMB, CKMBINDEX, TROPONINI in the last 168 hours. BNP (last 3 results) No results for input(s): PROBNP in the last 8760 hours. HbA1C: No results for input(s): HGBA1C in the last 72 hours. CBG: No results for input(s): GLUCAP in the last 168 hours. Lipid Profile: No results for input(s): CHOL, HDL, LDLCALC, TRIG, CHOLHDL, LDLDIRECT in the last 72 hours. Thyroid Function Tests: No results for input(s): TSH, T4TOTAL, FREET4, T3FREE, THYROIDAB in the last 72 hours. Anemia Panel: No results for input(s): VITAMINB12, FOLATE, FERRITIN, TIBC, IRON, RETICCTPCT in the last 72 hours. Urine analysis:    Component Value Date/Time   COLORURINE YELLOW 12/08/2017 1758   APPEARANCEUR CLEAR 12/08/2017 1758   LABSPEC 1.013 12/08/2017 1758   PHURINE 6.0 12/08/2017 1758   GLUCOSEU NEGATIVE 12/08/2017 1758   HGBUR NEGATIVE 12/08/2017 1758   BILIRUBINUR NEGATIVE 12/08/2017 1758   KETONESUR NEGATIVE 12/08/2017 1758   PROTEINUR NEGATIVE 12/08/2017 1758   NITRITE NEGATIVE 12/08/2017 1758   LEUKOCYTESUR NEGATIVE 12/08/2017 1758   Sepsis Labs: @LABRCNTIP (procalcitonin:4,lacticidven:4)  ) Recent Results (from the past 240 hour(s))  Group A Strep by PCR     Status: None   Collection Time: 12/08/17  5:10 PM  Result Value Ref Range Status   Group A Strep by PCR NOT DETECTED NOT DETECTED Final    Comment: Performed at [facility], [place] [address] [place], [place] [address]  Respiratory Panel by PCR     Status: None   Collection Time: 12/08/17  7:01 PM  Result Value Ref Range Status   Adenovirus NOT DETECTED NOT DETECTED Final   Coronavirus 229E NOT DETECTED NOT DETECTED Final   Coronavirus HKU1 NOT DETECTED NOT  DETECTED Final   Coronavirus NL63 NOT DETECTED NOT DETECTED Final   Coronavirus OC43 NOT DETECTED NOT DETECTED Final   Metapneumovirus NOT DETECTED NOT DETECTED Final   Rhinovirus / Enterovirus NOT DETECTED NOT DETECTED Final   Influenza A NOT DETECTED NOT DETECTED Final   Influenza B NOT DETECTED NOT DETECTED Final   Parainfluenza Virus 1 NOT DETECTED NOT DETECTED Final   Parainfluenza Virus 2 NOT DETECTED NOT DETECTED Final   Parainfluenza Virus 3 NOT DETECTED NOT DETECTED Final   Parainfluenza Virus 4 NOT DETECTED NOT DETECTED Final   Respiratory Syncytial Virus NOT DETECTED NOT DETECTED Final   Bordetella pertussis NOT DETECTED NOT DETECTED Final   Chlamydophila pneumoniae NOT DETECTED NOT DETECTED Final   Mycoplasma pneumoniae NOT DETECTED NOT DETECTED Final    Comment: Performed at [facility], [place] [address] [place], [place] [address]      Studies: Dg Chest 2 View  Result Date: 12/08/2017 CLINICAL  DATA:  Cough.  History of acute myelogenous leukemia EXAM: CHEST - 2 VIEW COMPARISON:  None. FINDINGS: Port-A-Cath tip is in the superior vena cava near the cavoatrial junction. No pneumothorax. Lungs are clear. Heart size and pulmonary vascularity are normal. No adenopathy. No bone lesions. IMPRESSION: Port-A-Cath tip immediately superior to cavoatrial junction. No pneumothorax. Lungs clear. No evident adenopathy. Electronically Signed   By: Lowella Grip III M.D.   On: 12/08/2017 17:36    Scheduled Meds: . acyclovir  400 mg Oral BID  . amLODipine  10 mg Oral Daily  . fluticasone  1 spray Each Nare Daily  . voriconazole  200 mg Oral Q12H    Continuous Infusions: . ceFEPime (MAXIPIME) IV Stopped (12/09/17 0840)     LOS: 1 day     Alma Friendly, MD [facility]   If 7PM-7AM, please contact night-coverage [email or web address] Password [email or web address] 12/09/2017, 1:06 PM

## 2017-12-09 NOTE — ED Notes (Signed)
ED TO INPATIENT HANDOFF REPORT  Name/Age/Gender Derrick Callahan 47 y.o. male  Code Status    Code Status Orders  (From admission, onward)        Start     Ordered   12/08/17 2153  Full code  Continuous     12/08/17 2152    Code Status History    This patient has a current code status but no historical code status.      Home/SNF/Other Home  Chief Complaint cancer patient; Cold symptoms   Level of Care/Admitting Diagnosis ED Disposition    ED Disposition Condition [facility]: [facility] [[id number]]  Level of Care: Med-Surg [16]  Diagnosis: Neutropenic fever [facility]) [[id number]]  Admitting Physician: Vianne Bulls [[id number]]  Attending Physician: Vianne Bulls [[id number]]  Estimated length of stay: past midnight tomorrow  Certification:: I certify this patient will need inpatient services for at least 2 midnights  PT Class (Do Not Modify): Inpatient [101]  PT Acc Code (Do Not Modify): Private [1]       Medical History Past Medical History:  Diagnosis Date  . 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 Hepatitis C   . Hypertension     Allergies No Known Allergies  IV Location/Drains/Wounds Patient Lines/Drains/Airways Status   Active Line/Drains/Airways    Name:   Placement date:   Placement time:   Site:   Days:   Implanted Port 07/12/14 Right Chest   07/12/14    0800    Chest   1246   Peripheral IV 12/08/17 Left Forearm   12/08/17    1748    Forearm   1          Labs/Imaging Results for orders placed or performed during the hospital encounter of 12/08/17 (from the past 48 hour(s))  Basic metabolic panel     Status: Abnormal   Collection Time: 12/08/17  5:10 PM  Result Value Ref Range   Sodium 139 135 - 145 mmol/L   Potassium 3.7 3.5 - 5.1 mmol/L   Chloride 105 101 - 111 mmol/L   CO2 25 22 - 32 mmol/L   Glucose, Bld 113 (H) 65 - 99 mg/dL   BUN 12 6 - 20 mg/dL   Creatinine, Ser 1.17 0.61 - 1.24 mg/dL   Calcium 8.8 (L) 8.9 - 10.3 mg/dL   GFR calc non Af Amer >60 >60 mL/min   GFR calc Af Amer >60 >60 mL/min    Comment: (NOTE) The eGFR has been calculated using the CKD EPI equation. This calculation has not been validated in all clinical situations. eGFR's persistently <60 mL/min signify possible Chronic Kidney Disease.    Anion gap 9 5 - 15    Comment: Performed at [facility], [place] [address] [place], [place] [address]  CBC with Differential     Status: Abnormal   Collection Time: 12/08/17  5:10 PM  Result Value Ref Range   WBC 1.0 (LL) 4.0 - 10.5 K/uL    Comment: REPEATED TO VERIFY CRITICAL RESULT CALLED TO, READ BACK BY AND VERIFIED WITH: S.WEST AT 1834 ON 12/08/17 BY N.THOMPSON    RBC 2.86 (L) 4.22 - 5.81 MIL/uL   Hemoglobin 10.0 (L) 13.0 - 17.0 g/dL   HCT 28.1 (L) 39.0 - 52.0 %   MCV 98.3 78.0 - 100.0 fL   MCH 35.0 (H) 26.0 - 34.0 pg   MCHC 35.6 30.0 - 36.0 g/dL   RDW 15.4 11.5 -  15.5 %   Platelets 87 (L) 150 - 400 K/uL    Comment: REPEATED TO VERIFY SPECIMEN CHECKED FOR CLOTS PLATELET COUNT CONFIRMED BY SMEAR    Neutrophils Relative % 23 %   Lymphocytes Relative 71 %   Monocytes Relative 1 %   Eosinophils Relative 5 %   Basophils Relative 0 %   Neutro Abs 0.2 (L) 1.7 - 7.7 K/uL   Lymphs Abs 0.7 0.7 - 4.0 K/uL   Monocytes Absolute 0.0 (L) 0.1 - 1.0 K/uL   Eosinophils Absolute 0.1 0.0 - 0.7 K/uL   Basophils Absolute 0.0 0.0 - 0.1 K/uL   RBC Morphology POLYCHROMASIA PRESENT    WBC Morphology TOXIC GRANULATION     Comment: WHITE COUNT CONFIRMED ON SMEAR Performed at [facility], [place] [address] [place], [place] [address]   Group A Strep by PCR     Status: None   Collection Time: 12/08/17  5:10 PM  Result Value Ref Range   Group A Strep by PCR NOT DETECTED NOT DETECTED    Comment: Performed at [facility], [place] [address], [place], [place] [address]  I-Stat CG4 Lactic Acid, ED     Status: None   Collection  Time: 12/08/17  5:56 PM  Result Value Ref Range   Lactic Acid, Venous 0.54 0.5 - 1.9 mmol/L  Urinalysis, Routine w reflex microscopic     Status: None   Collection Time: 12/08/17  5:58 PM  Result Value Ref Range   Color, Urine YELLOW YELLOW   APPearance CLEAR CLEAR   Specific Gravity, Urine 1.013 1.005 - 1.030   pH 6.0 5.0 - 8.0   Glucose, UA NEGATIVE NEGATIVE mg/dL   Hgb urine dipstick NEGATIVE NEGATIVE   Bilirubin Urine NEGATIVE NEGATIVE   Ketones, ur NEGATIVE NEGATIVE mg/dL   Protein, ur NEGATIVE NEGATIVE mg/dL   Nitrite NEGATIVE NEGATIVE   Leukocytes, UA NEGATIVE NEGATIVE    Comment: Performed at [place] [address], [place], [place] [address]  Influenza panel by PCR (type A & B)     Status: None   Collection Time: 12/08/17  7:01 PM  Result Value Ref Range   Influenza A By PCR NEGATIVE NEGATIVE   Influenza B By PCR NEGATIVE NEGATIVE    Comment: (NOTE) The Xpert Xpress Flu assay is intended as an aid in the diagnosis of  influenza and should not be used as a sole basis for treatment.  This  assay is FDA approved for nasopharyngeal swab specimens only. Nasal  washings and aspirates are unacceptable for Xpert Xpress Flu testing. Performed at [facility], [place] [address] [place], [place] [address]   Hepatic function panel     Status: None   Collection Time: 12/08/17  7:02 PM  Result Value Ref Range   Total Protein 7.4 6.5 - 8.1 g/dL   Albumin 3.6 3.5 - 5.0 g/dL   AST 33 15 - 41 U/L   ALT 32 17 - 63 U/L   Alkaline Phosphatase 65 38 - 126 U/L   Total Bilirubin 1.1 0.3 - 1.2 mg/dL   Bilirubin, Direct 0.3 0.1 - 0.5 mg/dL   Indirect Bilirubin 0.8 0.3 - 0.9 mg/dL    Comment: Performed at [facility], [place] [address] [place], [place] [address]   Dg Chest 2 View  Result Date: 12/08/2017 CLINICAL DATA:  Cough.  History of acute myelogenous leukemia EXAM: CHEST - 2 VIEW COMPARISON:  None. FINDINGS: Port-A-Cath tip  is in the superior vena cava near the cavoatrial junction. No pneumothorax. Lungs are clear. Heart size and pulmonary vascularity are normal. No adenopathy. No bone lesions. IMPRESSION: Port-A-Cath tip immediately superior to cavoatrial junction. No pneumothorax. Lungs clear. No evident adenopathy. Electronically Signed   By: Lowella Grip III M.D.   On: 12/08/2017 17:36    Pending Labs Unresulted Labs (From admission, onward)   Start     Ordered   12/09/17 0500  HIV antibody (Routine Testing)  Tomorrow morning,   R     12/08/17 2152   12/09/17 0500  CBC with Differential/Platelet  Tomorrow morning,   R     12/08/17 2155   12/09/17 0093  Basic metabolic panel  Tomorrow morning,   R     12/08/17 2155   12/08/17 1901  Respiratory Panel by PCR  (Respiratory virus panel)  Once,   R     12/08/17 1901   12/08/17 1721  Culture, group A strep  Once,   R     12/08/17 1721   12/08/17 1721  Mononucleosis screen  STAT,   STAT     12/08/17 1721   12/08/17 1710  Culture, blood (routine x 2)  BLOOD CULTURE X 2,   STAT     12/08/17 1721      Vitals/Pain Today's Vitals   12/08/17 1747 12/08/17 1839 12/08/17 1922 12/08/17 2100  BP: 120/74 116/76  123/83  Pulse: 80 84  86  Resp: _0 Temp:    98 F (36.7 C)  TempSrc:    Oral  SpO2: 98% 97%  99%  Weight:      Height:      PainSc:   9      Isolation Precautions Protective Precautions  Medications Medications  ceFEPIme (MAXIPIME) 2 g in sodium chloride 0.9 % 100 mL IVPB (2 g Intravenous New Bag/Given 12/09/17 0054)  voriconazole (VFEND) 200 mg in sodium chloride 0.9 % 100 mL IVPB (0 mg Intravenous Stopped 12/09/17 0053)  acyclovir (ZOVIRAX) tablet 400 mg (has no administration in time range)  voriconazole (VFEND) tablet 200 mg (has no administration in time range)  ceFEPIme (MAXIPIME) 2 g in sodium chloride 0.9 % 100 mL IVPB (has no administration in time range)  amLODipine (NORVASC) tablet 10 mg (has no administration in time  range)  traMADol (ULTRAM) tablet 50-100 mg (has no administration in time range)  acetaminophen (TYLENOL) tablet 650 mg (has no administration in time range)    Or  acetaminophen (TYLENOL) suppository 650 mg (has no administration in time range)  senna-docusate (Senokot-S) tablet 1 tablet (has no administration in time range)  ondansetron (ZOFRAN) tablet 4 mg (has no administration in time range)    Or  ondansetron (ZOFRAN) injection 4 mg (has no administration in time range)  LORazepam (ATIVAN) tablet 0.5 mg (has no administration in time range)  zolpidem (AMBIEN) tablet 5 mg (has no administration in time range)  acyclovir (ZOVIRAX) 400 mg in dextrose 5 % 100 mL IVPB (0 mg Intravenous Stopped 12/09/17 0053)    Mobility walks

## 2017-12-09 NOTE — Progress Notes (Signed)
PHARMACY NOTE:  ANTIMICROBIAL  DOSAGE ADJUSTMENT  Current antimicrobial regimen includes a mismatch between antimicrobial dosage and indication.  As per policy approved by the Pharmacy & Therapeutics and Medical Executive Committees, the antimicrobial dosage will be adjusted accordingly.  Current antimicrobial dosage:  Cefepime 2 Gm IV q6h  Indication: Febrile neutroenia  Renal Function:  Estimated Creatinine Clearance: 83.2 mL/min (by C-G formula based on SCr of 1.17 mg/dL). []      On intermittent HD, scheduled: []      On CRRT    Antimicrobial dosage has been changed to:  Cefepime 2 Gm IV q8h    Thank you for allowing pharmacy to be a part of this patient's care.  Dorrene German, [facility] 12/09/2017 12:40 AM

## 2017-12-10 LAB — CBC WITH DIFFERENTIAL/PLATELET
BASOS ABS: 0 10*3/uL (ref 0.0–0.1)
Basophils Relative: 0 %
Eosinophils Absolute: 0.1 10*3/uL (ref 0.0–0.7)
Eosinophils Relative: 6 %
HEMATOCRIT: 27.1 % — AB (ref 39.0–52.0)
Hemoglobin: 9.7 g/dL — ABNORMAL LOW (ref 13.0–17.0)
LYMPHS ABS: 0.8 10*3/uL (ref 0.7–4.0)
LYMPHS PCT: 73 %
MCH: 34.6 pg — AB (ref 26.0–34.0)
MCHC: 35.8 g/dL (ref 30.0–36.0)
MCV: 96.8 fL (ref 78.0–100.0)
MONO ABS: 0 10*3/uL — AB (ref 0.1–1.0)
Monocytes Relative: 0 %
NEUTROS ABS: 0.2 10*3/uL — AB (ref 1.7–7.7)
Neutrophils Relative %: 21 %
Platelets: 81 10*3/uL — ABNORMAL LOW (ref 150–400)
RBC: 2.8 MIL/uL — ABNORMAL LOW (ref 4.22–5.81)
RDW: 15.5 % (ref 11.5–15.5)
WBC: 1.1 10*3/uL — CL (ref 4.0–10.5)

## 2017-12-10 LAB — BASIC METABOLIC PANEL
ANION GAP: 7 (ref 5–15)
BUN: 9 mg/dL (ref 6–20)
CALCIUM: 8.8 mg/dL — AB (ref 8.9–10.3)
CO2: 27 mmol/L (ref 22–32)
Chloride: 105 mmol/L (ref 101–111)
Creatinine, Ser: 1.11 mg/dL (ref 0.61–1.24)
GFR calc Af Amer: >60 mL/min (ref >60)
GFR calc non Af Amer: >60 mL/min (ref >60)
GLUCOSE: 138 mg/dL — AB (ref 65–99)
POTASSIUM: 3.8 mmol/L (ref 3.5–5.1)
Sodium: 139 mmol/L (ref 135–145)

## 2017-12-10 NOTE — Progress Notes (Signed)
PROGRESS NOTE  Dwyne Hasegawa KGU:[id number] DOB: 05-20-1971 DOA: 12/08/2017 PCP: Patient, No Pcp Per  HPI/Recap of past 24 hours: Stacy Deshler is a 47 y.o. male with medical history significant for acute myeloid leukemia, hepatitis C, and hypertension, presenting to the ED for evaluation of sore throat, chills, and productive cough for the past week PTA.  Patient follows with oncology at [facility] and reports that his last chemotherapy infusion was approximately 1 month ago. In the ED, CBC is notable for a leukopenia with WBC 1000, normocytic anemia with hemoglobin 10.0, and thrombocytopenia with platelets 87,000. Chest x-ray is clear.  UA unremarkable. Oncology at [facility] recommends admission (no oncology bed at their facility) with empiric cefepime, voriconazole, and acyclovir, with plan for transfer to [facility]. Spoke to Oncology on call at [facility] on 12/09/17, suggested pt stay in [place] as currently pt is stable and they wouldn't do anything different over at [facility].  Today pt reported feeling better, cough improved, sore-throat resolved. Denies any fever/chills, headaches, abdominal pain, chest pain.   Assessment/Plan: Principal Problem:   Neutropenia with fever (HCC) Active Problems:   AML (acute myelogenous leukemia) (HCC)   Leukopenia due to antineoplastic chemotherapy ([place])   Pancytopenia due to antineoplastic chemotherapy ([place])   Neutropenic fever ([place])   Essential hypertension  Neutropenic fever/Hx of AML on chemo likely due to CAP Currently afebrile, neutropenic (ANC 0.2) CT chest showed: LLL pneumonia with a minimal area of airspace disease in the right upper lobe also concerning for pneumonia UA unremarkable, rapid strep neg, viral panel negative Blood cultures X 2 NGTD Spoke to Oncologist on call at [facility] on 12/09/17, cell phone no is [phone] rec no need to transfer to [facility] as they wouldn't do anything different. Call if clinical status worsens Continue cefepime,  voriconazole, and acyclovir  Daily CBC  AKI Resolving, encourage oral intake Daily BMP  AML  Follows with oncology at [facility]   Hypertension  BP at goal, continue Norvasc    Pancytopenia Likely due to chemo/AML No bleeding Daily CBC     Code Status: Full  Family Communication: None at bedside   Disposition Plan: Likely D/C on 12/11/17   Consultants:  Spoke with [facility] oncologist on call on 12/09/17  Procedures:  None  Antimicrobials:  IV Cefepime  IV Acyclovir  PO Voriconazole  DVT prophylaxis:  SCDs due to pancytopenia. Pt encouraged to ambulate   Objective: Vitals:   12/09/17 1409 12/09/17 2058 12/10/17 0455 12/10/17 1424  BP: 126/87 130/85 115/73 124/79  Pulse: 77 81 63 82  Resp: 18 20 18 15   Temp: 98.3 F (36.8 C) 98.3 F (36.8 C) 98.1 F (36.7 C) 98.2 F (36.8 C)  TempSrc: Oral Oral Oral Oral  SpO2: 97% 98% 100% 99%  Weight:      Height:        Intake/Output Summary (Last 24 hours) at 12/10/2017 1526 Last data filed at 12/10/2017 1446 Gross per 24 hour  Intake 680 ml  Output 125 ml  Net 555 ml   Filed Weights   12/08/17 1522  Weight: 85.7 kg (189 lb)    Exam:   General:  NAD  Cardiovascular: S1, S2 present  Respiratory: CTAB  Abdomen: Soft, NT, ND, BS present  Musculoskeletal: No pedal edema bilaterally  Skin: Normal  Psychiatry: Normal mood   Data Reviewed: CBC: Recent Labs  Lab 12/08/17 1710 12/09/17 0322 12/10/17 0330  WBC 1.0* 1.0* 1.1*  NEUTROABS 0.2* 0.2* 0.2*  HGB 10.0* 9.6* 9.7*  HCT 28.1* 26.8* 27.1*  MCV 98.3 98.5 96.8  PLT 87* 86* 81*   Basic Metabolic Panel: Recent Labs  Lab 12/08/17 1710 12/09/17 0322 12/10/17 0330  NA 139 139 139  K 3.7 3.6 3.8  CL 105 104 105  CO2 25 27 27   GLUCOSE 113* 133* 138*  BUN 12 11 9   CREATININE 1.17 1.32* 1.11  CALCIUM 8.8* 8.7* 8.8*   GFR: Estimated Creatinine Clearance: 87.7 mL/min (by C-G formula based on SCr of 1.11 mg/dL). Liver Function  Tests: Recent Labs  Lab 12/08/17 1902  AST 33  ALT 32  ALKPHOS 65  BILITOT 1.1  PROT 7.4  ALBUMIN 3.6   No results for input(s): LIPASE, AMYLASE in the last 168 hours. No results for input(s): AMMONIA in the last 168 hours. Coagulation Profile: No results for input(s): INR, PROTIME in the last 168 hours. Cardiac Enzymes: No results for input(s): CKTOTAL, CKMB, CKMBINDEX, TROPONINI in the last 168 hours. BNP (last 3 results) No results for input(s): PROBNP in the last 8760 hours. HbA1C: No results for input(s): HGBA1C in the last 72 hours. CBG: No results for input(s): GLUCAP in the last 168 hours. Lipid Profile: No results for input(s): CHOL, HDL, LDLCALC, TRIG, CHOLHDL, LDLDIRECT in the last 72 hours. Thyroid Function Tests: No results for input(s): TSH, T4TOTAL, FREET4, T3FREE, THYROIDAB in the last 72 hours. Anemia Panel: No results for input(s): VITAMINB12, FOLATE, FERRITIN, TIBC, IRON, RETICCTPCT in the last 72 hours. Urine analysis:    Component Value Date/Time   COLORURINE YELLOW 12/08/2017 1758   APPEARANCEUR CLEAR 12/08/2017 1758   LABSPEC 1.013 12/08/2017 1758   PHURINE 6.0 12/08/2017 1758   GLUCOSEU NEGATIVE 12/08/2017 1758   HGBUR NEGATIVE 12/08/2017 1758   BILIRUBINUR NEGATIVE 12/08/2017 1758   KETONESUR NEGATIVE 12/08/2017 1758   PROTEINUR NEGATIVE 12/08/2017 1758   NITRITE NEGATIVE 12/08/2017 1758   LEUKOCYTESUR NEGATIVE 12/08/2017 1758   Sepsis Labs: @LABRCNTIP (procalcitonin:4,lacticidven:4)  ) Recent Results (from the past 240 hour(s))  Group A Strep by PCR     Status: None   Collection Time: 12/08/17  5:10 PM  Result Value Ref Range Status   Group A Strep by PCR NOT DETECTED NOT DETECTED Final    Comment: Performed at [facility], [place] [address] [place], [place] [address]  Culture, blood (routine x 2)     Status: None (Preliminary result)   Collection Time: 12/08/17  5:10 PM  Result Value Ref Range Status   Specimen  Description   Final    BLOOD RIGHT ANTECUBITAL Performed at [place] [address], [place], [place] [address]    Special Requests   Final    BOTTLES DRAWN AEROBIC AND ANAEROBIC Blood Culture adequate volume Performed at [place] [address], [place], [place] [address]    Culture   Final    NO GROWTH 2 DAYS Performed at [place] [address], [place], [place] [address]    Report Status PENDING  Incomplete  Culture, blood (routine x 2)     Status: None (Preliminary result)   Collection Time: 12/08/17  5:15 PM  Result Value Ref Range Status   Specimen Description   Final    BLOOD RIGHT ANTECUBITAL Performed at [place] [address], [place], [place] [address]    Special Requests   Final    BOTTLES DRAWN AEROBIC AND ANAEROBIC Blood Culture adequate volume Performed at [place]  [address] [place], [place] [address]    Culture   Final    NO GROWTH 2 DAYS Performed at [place] [address] [place], [place] [address]    Report Status PENDING  Incomplete  Respiratory Panel by PCR     Status: None   Collection Time: 12/08/17  7:01 PM  Result Value Ref Range Status   Adenovirus NOT DETECTED NOT DETECTED Final   Coronavirus 229E NOT DETECTED NOT DETECTED Final   Coronavirus HKU1 NOT DETECTED NOT DETECTED Final   Coronavirus NL63 NOT DETECTED NOT DETECTED Final   Coronavirus OC43 NOT DETECTED NOT DETECTED Final   Metapneumovirus NOT DETECTED NOT DETECTED Final   Rhinovirus / Enterovirus NOT DETECTED NOT DETECTED Final   Influenza A NOT DETECTED NOT DETECTED Final   Influenza B NOT DETECTED NOT DETECTED Final   Parainfluenza Virus 1 NOT DETECTED NOT DETECTED Final   Parainfluenza Virus 2 NOT DETECTED NOT DETECTED Final   Parainfluenza Virus 3 NOT DETECTED NOT DETECTED Final   Parainfluenza Virus 4 NOT DETECTED NOT DETECTED Final   Respiratory Syncytial  Virus NOT DETECTED NOT DETECTED Final   Bordetella pertussis NOT DETECTED NOT DETECTED Final   Chlamydophila pneumoniae NOT DETECTED NOT DETECTED Final   Mycoplasma pneumoniae NOT DETECTED NOT DETECTED Final    Comment: Performed at [facility], [place] [address], [place], [place] [address]      Studies: No results found.  Scheduled Meds: . acyclovir  400 mg Oral BID  . amLODipine  10 mg Oral Daily  . fluticasone  1 spray Each Nare Daily  . voriconazole  200 mg Oral Q12H    Continuous Infusions: . ceFEPime (MAXIPIME) IV Stopped (12/10/17 0951)     LOS: 2 days     Alma Friendly, MD [facility]   If 7PM-7AM, please contact night-coverage [email or web address] Password [facility] 12/10/2017, 3:26 PM

## 2017-12-11 DIAGNOSIS — D701 Agranulocytosis secondary to cancer chemotherapy: Secondary | ICD-10-CM

## 2017-12-11 DIAGNOSIS — C9200 Acute myeloblastic leukemia, not having achieved remission: Secondary | ICD-10-CM

## 2017-12-11 DIAGNOSIS — D61810 Antineoplastic chemotherapy induced pancytopenia: Secondary | ICD-10-CM

## 2017-12-11 DIAGNOSIS — I10 Essential (primary) hypertension: Secondary | ICD-10-CM

## 2017-12-11 DIAGNOSIS — T451X5A Adverse effect of antineoplastic and immunosuppressive drugs, initial encounter: Secondary | ICD-10-CM

## 2017-12-11 LAB — BASIC METABOLIC PANEL
ANION GAP: 8 (ref 5–15)
BUN: 12 mg/dL (ref 6–20)
CHLORIDE: 106 mmol/L (ref 101–111)
CO2: 26 mmol/L (ref 22–32)
Calcium: 9 mg/dL (ref 8.9–10.3)
Creatinine, Ser: 1.03 mg/dL (ref 0.61–1.24)
GFR calc Af Amer: >60 mL/min (ref >60)
GFR calc non Af Amer: >60 mL/min (ref >60)
GLUCOSE: 125 mg/dL — AB (ref 65–99)
POTASSIUM: 4 mmol/L (ref 3.5–5.1)
Sodium: 140 mmol/L (ref 135–145)

## 2017-12-11 LAB — CBC WITH DIFFERENTIAL/PLATELET
BASOS PCT: 0 %
Basophils Absolute: 0 10*3/uL (ref 0.0–0.1)
EOS ABS: 0.1 10*3/uL (ref 0.0–0.7)
Eosinophils Relative: 8 %
HCT: 26.6 % — ABNORMAL LOW (ref 39.0–52.0)
HEMOGLOBIN: 9.5 g/dL — AB (ref 13.0–17.0)
LYMPHS PCT: 69 %
Lymphs Abs: 0.8 10*3/uL (ref 0.7–4.0)
MCH: 34.7 pg — ABNORMAL HIGH (ref 26.0–34.0)
MCHC: 35.7 g/dL (ref 30.0–36.0)
MCV: 97.1 fL (ref 78.0–100.0)
MONOS PCT: 4 %
Monocytes Absolute: 0 10*3/uL — ABNORMAL LOW (ref 0.1–1.0)
NEUTROS ABS: 0.2 10*3/uL — AB (ref 1.7–7.7)
NEUTROS PCT: 19 %
PLATELETS: 86 10*3/uL — AB (ref 150–400)
RBC: 2.74 MIL/uL — ABNORMAL LOW (ref 4.22–5.81)
RDW: 15.7 % — ABNORMAL HIGH (ref 11.5–15.5)
WBC: 1.1 10*3/uL — CL (ref 4.0–10.5)

## 2017-12-11 NOTE — Progress Notes (Signed)
PROGRESS NOTE  Derrick Callahan TFT:[id number] DOB: Nov 06, 1970 DOA: 12/08/2017 PCP: Patient, No Pcp Per  HPI/Recap of past 24 hours: Derrick Callahan is a 47 y.o. male with medical history significant for acute myeloid leukemia, hepatitis C, and hypertension, presenting to the ED for evaluation of sore throat, chills, and productive cough for the past week PTA.  Patient follows with oncology at [facility] and reports that his last chemotherapy infusion was approximately 1 month ago. In the ED, CBC is notable for a leukopenia with WBC 1000, normocytic anemia with hemoglobin 10.0, and thrombocytopenia with platelets 87,000. Chest x-ray is clear.  UA unremarkable. Oncology at [facility] recommends admission (no oncology bed at their facility) with empiric cefepime, voriconazole, and acyclovir, with plan for transfer to [facility]. Spoke to Oncology on call at [facility] on 12/09/17, suggested pt stay in [place] as currently pt is stable and they wouldn't do anything different over at [facility].  Today pt reported feeling better. Denies any fever/chills, headaches, abdominal pain, chest pain. Very eager to go home.   Assessment/Plan: Principal Problem:   Neutropenia with fever (HCC) Active Problems:   AML (acute myelogenous leukemia) (HCC)   Leukopenia due to antineoplastic chemotherapy ([place])   Pancytopenia due to antineoplastic chemotherapy ([place])   Neutropenic fever ([place])   Essential hypertension  Neutropenic fever/Hx of AML on chemo likely due to CAP Currently afebrile, neutropenic (ANC 0.2) CT chest showed: LLL pneumonia with a minimal area of airspace disease in the right upper lobe also concerning for pneumonia UA unremarkable, rapid strep neg, viral panel negative Blood cultures X 2 NGTD Spoke to Oncologist on call at [facility] on 12/11/17, cell phone no is [phone] rec patient stay in hospital for the entire duration of IV antibiotics, total of 7 days (end date will be 12/14/17) due to evidence of PNA  on CT chest and pt still neutropenic. Continue cefepime, voriconazole, and acyclovir  Daily CBC  AKI Resolving, encourage oral intake Daily BMP  AML  Follows with oncology at [facility]   Hypertension  BP at goal, continue Norvasc    Pancytopenia Likely due to chemo/AML No bleeding Daily CBC     Code Status: Full  Family Communication: None at bedside   Disposition Plan: Likely D/C on 12/14/17   Consultants:  Spoke with [facility] oncologist on call on 12/11/17  Procedures:  None  Antimicrobials:  IV Cefepime  IV Acyclovir  PO Voriconazole  DVT prophylaxis:  SCDs due to pancytopenia. Pt encouraged to ambulate   Objective: Vitals:   12/10/17 1424 12/10/17 2110 12/11/17 0509 12/11/17 1405  BP: 124/79 112/72 116/78 112/71  Pulse: 82 84 61 68  Resp: 15 20 20 18   Temp: 98.2 F (36.8 C) 98.4 F (36.9 C) 98 F (36.7 C) 97.9 F (36.6 C)  TempSrc: Oral Oral Oral Oral  SpO2: 99% 98% 98% 99%  Weight:      Height:        Intake/Output Summary (Last 24 hours) at 12/11/2017 1656 Last data filed at 12/11/2017 1400 Gross per 24 hour  Intake 480 ml  Output 0 ml  Net 480 ml   Filed Weights   12/08/17 1522  Weight: 85.7 kg (189 lb)    Exam:   General:  NAD  Cardiovascular: S1, S2 present  Respiratory: CTAB  Abdomen: Soft, NT, ND, BS present  Musculoskeletal: No pedal edema bilaterally  Skin: Normal  Psychiatry: Normal mood   Data Reviewed: CBC: Recent Labs  Lab 12/08/17 1710 12/09/17 0322 12/10/17  0330 12/11/17 0436  WBC 1.0* 1.0* 1.1* 1.1*  NEUTROABS 0.2* 0.2* 0.2* 0.2*  HGB 10.0* 9.6* 9.7* 9.5*  HCT 28.1* 26.8* 27.1* 26.6*  MCV 98.3 98.5 96.8 97.1  PLT 87* 86* 81* 86*   Basic Metabolic Panel: Recent Labs  Lab 12/08/17 1710 12/09/17 0322 12/10/17 0330 12/11/17 0436  NA 139 139 139 140  K 3.7 3.6 3.8 4.0  CL 105 104 105 106  CO2 25 27 27 26   GLUCOSE 113* 133* 138* 125*  BUN 12 11 9 12   CREATININE 1.17 1.32* 1.11 1.03  CALCIUM  8.8* 8.7* 8.8* 9.0   GFR: Estimated Creatinine Clearance: 94.5 mL/min (by C-G formula based on SCr of 1.03 mg/dL). Liver Function Tests: Recent Labs  Lab 12/08/17 1902  AST 33  ALT 32  ALKPHOS 65  BILITOT 1.1  PROT 7.4  ALBUMIN 3.6   No results for input(s): LIPASE, AMYLASE in the last 168 hours. No results for input(s): AMMONIA in the last 168 hours. Coagulation Profile: No results for input(s): INR, PROTIME in the last 168 hours. Cardiac Enzymes: No results for input(s): CKTOTAL, CKMB, CKMBINDEX, TROPONINI in the last 168 hours. BNP (last 3 results) No results for input(s): PROBNP in the last 8760 hours. HbA1C: No results for input(s): HGBA1C in the last 72 hours. CBG: No results for input(s): GLUCAP in the last 168 hours. Lipid Profile: No results for input(s): CHOL, HDL, LDLCALC, TRIG, CHOLHDL, LDLDIRECT in the last 72 hours. Thyroid Function Tests: No results for input(s): TSH, T4TOTAL, FREET4, T3FREE, THYROIDAB in the last 72 hours. Anemia Panel: No results for input(s): VITAMINB12, FOLATE, FERRITIN, TIBC, IRON, RETICCTPCT in the last 72 hours. Urine analysis:    Component Value Date/Time   COLORURINE YELLOW 12/08/2017 1758   APPEARANCEUR CLEAR 12/08/2017 1758   LABSPEC 1.013 12/08/2017 1758   PHURINE 6.0 12/08/2017 1758   GLUCOSEU NEGATIVE 12/08/2017 1758   HGBUR NEGATIVE 12/08/2017 1758   BILIRUBINUR NEGATIVE 12/08/2017 1758   KETONESUR NEGATIVE 12/08/2017 1758   PROTEINUR NEGATIVE 12/08/2017 1758   NITRITE NEGATIVE 12/08/2017 1758   LEUKOCYTESUR NEGATIVE 12/08/2017 1758   Sepsis Labs: @LABRCNTIP (procalcitonin:4,lacticidven:4)  ) Recent Results (from the past 240 hour(s))  Group A Strep by PCR     Status: None   Collection Time: 12/08/17  5:10 PM  Result Value Ref Range Status   Group A Strep by PCR NOT DETECTED NOT DETECTED Final    Comment: Performed at [facility], [place] [address], [place], [place] [address]  Culture, blood  (routine x 2)     Status: None (Preliminary result)   Collection Time: 12/08/17  5:10 PM  Result Value Ref Range Status   Specimen Description   Final    BLOOD RIGHT ANTECUBITAL Performed at [place] [address], [place], [place] [address]    Special Requests   Final    BOTTLES DRAWN AEROBIC AND ANAEROBIC Blood Culture adequate volume Performed at [place] [address] [place], [place] [address]    Culture   Final    NO GROWTH 3 DAYS Performed at [facility], [place] [address] [place], [place] [address]    Report Status PENDING  Incomplete  Culture, blood (routine x 2)     Status: None (Preliminary result)   Collection Time: 12/08/17  5:15 PM  Result Value Ref Range Status   Specimen Description   Final    BLOOD RIGHT ANTECUBITAL Performed at [facility],  [place] [address] [place], [place] [address]    Special Requests   Final    BOTTLES DRAWN AEROBIC AND ANAEROBIC Blood Culture adequate volume Performed at [place] [address] [place], [place] [address]    Culture   Final    NO GROWTH 3 DAYS Performed at [facility], [place] [address] [place], [place] [address]    Report Status PENDING  Incomplete  Respiratory Panel by PCR     Status: None   Collection Time: 12/08/17  7:01 PM  Result Value Ref Range Status   Adenovirus NOT DETECTED NOT DETECTED Final   Coronavirus 229E NOT DETECTED NOT DETECTED Final   Coronavirus HKU1 NOT DETECTED NOT DETECTED Final   Coronavirus NL63 NOT DETECTED NOT DETECTED Final   Coronavirus OC43 NOT DETECTED NOT DETECTED Final   Metapneumovirus NOT DETECTED NOT DETECTED Final   Rhinovirus / Enterovirus NOT DETECTED NOT DETECTED Final   Influenza A NOT DETECTED NOT DETECTED Final   Influenza B NOT DETECTED NOT DETECTED Final   Parainfluenza Virus 1 NOT DETECTED NOT DETECTED Final   Parainfluenza Virus 2 NOT DETECTED NOT DETECTED Final    Parainfluenza Virus 3 NOT DETECTED NOT DETECTED Final   Parainfluenza Virus 4 NOT DETECTED NOT DETECTED Final   Respiratory Syncytial Virus NOT DETECTED NOT DETECTED Final   Bordetella pertussis NOT DETECTED NOT DETECTED Final   Chlamydophila pneumoniae NOT DETECTED NOT DETECTED Final   Mycoplasma pneumoniae NOT DETECTED NOT DETECTED Final    Comment: Performed at [facility], [place] [address], [place], [place] [address]      Studies: No results found.  Scheduled Meds: . acyclovir  400 mg Oral BID  . amLODipine  10 mg Oral Daily  . fluticasone  1 spray Each Nare Daily  . voriconazole  200 mg Oral Q12H    Continuous Infusions: . ceFEPime (MAXIPIME) IV 2 g (12/11/17 1609)     LOS: 3 days     Alma Friendly, MD [facility]   If 7PM-7AM, please contact night-coverage [email or web address] Password [email or web address] 12/11/2017, 4:56 PM

## 2017-12-12 LAB — CBC WITH DIFFERENTIAL/PLATELET
BAND NEUTROPHILS: 0 %
BASOS ABS: 0 10*3/uL (ref 0.0–0.1)
Basophils Relative: 0 %
Blasts: 0 %
EOS PCT: 6 %
Eosinophils Absolute: 0.1 10*3/uL (ref 0.0–0.7)
HCT: 26.5 % — ABNORMAL LOW (ref 39.0–52.0)
Hemoglobin: 9.6 g/dL — ABNORMAL LOW (ref 13.0–17.0)
LYMPHS ABS: 0.8 10*3/uL (ref 0.7–4.0)
Lymphocytes Relative: 71 %
MCH: 35.6 pg — ABNORMAL HIGH (ref 26.0–34.0)
MCHC: 36.2 g/dL — ABNORMAL HIGH (ref 30.0–36.0)
MCV: 98.1 fL (ref 78.0–100.0)
METAMYELOCYTES PCT: 0 %
MONO ABS: 0 10*3/uL — AB (ref 0.1–1.0)
MONOS PCT: 3 %
Myelocytes: 0 %
Neutro Abs: 0.2 10*3/uL — ABNORMAL LOW (ref 1.7–7.7)
Neutrophils Relative %: 20 %
Platelets: 87 10*3/uL — ABNORMAL LOW (ref 150–400)
Promyelocytes Relative: 0 %
RBC: 2.7 MIL/uL — ABNORMAL LOW (ref 4.22–5.81)
RDW: 15.7 % — AB (ref 11.5–15.5)
WBC: 1.1 10*3/uL — CL (ref 4.0–10.5)
nRBC: 0 /100 WBC

## 2017-12-12 NOTE — Progress Notes (Signed)
PROGRESS NOTE  Dorsel Flinn BJS:[id number] DOB: July 08, 1971 DOA: 12/08/2017 PCP: Patient, No Pcp Per  HPI/Recap of past 24 hours: Gemini Beaumier is a 47 y.o. male with medical history significant for acute myeloid leukemia, hepatitis C, and hypertension, presenting to the ED for evaluation of sore throat, chills, and productive cough for the past week PTA.  Patient follows with oncology at [facility] and reports that his last chemotherapy infusion was approximately 1 month ago. In the ED, CBC is notable for a leukopenia with WBC 1000, normocytic anemia with hemoglobin 10.0, and thrombocytopenia with platelets 87,000. Chest x-ray is clear.  UA unremarkable. Oncology at [facility] recommends admission (no oncology bed at their facility) with empiric cefepime, voriconazole, and acyclovir, with plan for transfer to [facility]. Spoke to Oncology on call at [facility] on 12/09/17, suggested pt stay in [place] as currently pt is stable and they wouldn't do anything different over at [facility].  Today pt reported feeling better, so sad he wont be able to make it home for Easter. Denies any fever/chills, headaches, abdominal pain, chest pain.   Assessment/Plan: Principal Problem:   Neutropenia with fever (HCC) Active Problems:   AML (acute myelogenous leukemia) (HCC)   Leukopenia due to antineoplastic chemotherapy ([place])   Pancytopenia due to antineoplastic chemotherapy ([place])   Neutropenic fever ([place])   Essential hypertension  Neutropenic fever/Hx of AML on chemo likely due to CAP Currently afebrile, neutropenic (ANC 0.2) CT chest showed: LLL pneumonia with a minimal area of airspace disease in the right upper lobe also concerning for pneumonia UA unremarkable, rapid strep neg, viral panel negative Blood cultures X 2 NGTD Spoke to Oncologist on call at [facility] on 12/11/17, cell phone no is [phone] rec patient stay in hospital for the entire duration of IV antibiotics, total of 7 days (end date will be  12/14/17) due to evidence of PNA on CT chest and pt still neutropenic. Continue cefepime, voriconazole, and acyclovir  Daily CBC  AKI Resolved, encourage oral intake Daily BMP  AML  Follows with oncology at [facility]   Hypertension  BP at goal, continue Norvasc    Pancytopenia Likely due to chemo/AML No bleeding Daily CBC     Code Status: Full  Family Communication: None at bedside   Disposition Plan: Likely D/C on 12/14/17 after completion of IV antibiotics   Consultants:  Spoke with [facility] oncologist on call on 12/11/17  Procedures:  None  Antimicrobials:  IV Cefepime  IV Acyclovir  PO Voriconazole  DVT prophylaxis:  SCDs due to pancytopenia. Pt encouraged to ambulate   Objective: Vitals:   12/11/17 1405 12/11/17 2139 12/12/17 0600 12/12/17 1326  BP: 112/71 121/80 132/65 122/79  Pulse: 68 85 79 80  Resp: 18 18 18 16   Temp: 97.9 F (36.6 C) 98.6 F (37 C) 97.7 F (36.5 C) 98.2 F (36.8 C)  TempSrc: Oral Oral Oral Oral  SpO2: 99% 99% 100% 98%  Weight:      Height:        Intake/Output Summary (Last 24 hours) at 12/12/2017 1442 Last data filed at 12/12/2017 1000 Gross per 24 hour  Intake 960 ml  Output 0 ml  Net 960 ml   Filed Weights   12/08/17 1522  Weight: 85.7 kg (189 lb)    Exam:   General:  NAD  Cardiovascular: S1, S2 present  Respiratory: CTAB  Abdomen: Soft, NT, ND, BS present  Musculoskeletal: No pedal edema bilaterally  Skin: Normal  Psychiatry: Normal mood  Data Reviewed: CBC: Recent Labs  Lab 12/08/17 1710 12/09/17 0322 12/10/17 0330 12/11/17 0436 12/12/17 0340  WBC 1.0* 1.0* 1.1* 1.1* 1.1*  NEUTROABS 0.2* 0.2* 0.2* 0.2* 0.2*  HGB 10.0* 9.6* 9.7* 9.5* 9.6*  HCT 28.1* 26.8* 27.1* 26.6* 26.5*  MCV 98.3 98.5 96.8 97.1 98.1  PLT 87* 86* 81* 86* 87*   Basic Metabolic Panel: Recent Labs  Lab 12/08/17 1710 12/09/17 0322 12/10/17 0330 12/11/17 0436  NA 139 139 139 140  K 3.7 3.6 3.8 4.0  CL 105 104 105  106  CO2 25 27 27 26   GLUCOSE 113* 133* 138* 125*  BUN 12 11 9 12   CREATININE 1.17 1.32* 1.11 1.03  CALCIUM 8.8* 8.7* 8.8* 9.0   GFR: Estimated Creatinine Clearance: 94.5 mL/min (by C-G formula based on SCr of 1.03 mg/dL). Liver Function Tests: Recent Labs  Lab 12/08/17 1902  AST 33  ALT 32  ALKPHOS 65  BILITOT 1.1  PROT 7.4  ALBUMIN 3.6   No results for input(s): LIPASE, AMYLASE in the last 168 hours. No results for input(s): AMMONIA in the last 168 hours. Coagulation Profile: No results for input(s): INR, PROTIME in the last 168 hours. Cardiac Enzymes: No results for input(s): CKTOTAL, CKMB, CKMBINDEX, TROPONINI in the last 168 hours. BNP (last 3 results) No results for input(s): PROBNP in the last 8760 hours. HbA1C: No results for input(s): HGBA1C in the last 72 hours. CBG: No results for input(s): GLUCAP in the last 168 hours. Lipid Profile: No results for input(s): CHOL, HDL, LDLCALC, TRIG, CHOLHDL, LDLDIRECT in the last 72 hours. Thyroid Function Tests: No results for input(s): TSH, T4TOTAL, FREET4, T3FREE, THYROIDAB in the last 72 hours. Anemia Panel: No results for input(s): VITAMINB12, FOLATE, FERRITIN, TIBC, IRON, RETICCTPCT in the last 72 hours. Urine analysis:    Component Value Date/Time   COLORURINE YELLOW 12/08/2017 1758   APPEARANCEUR CLEAR 12/08/2017 1758   LABSPEC 1.013 12/08/2017 1758   PHURINE 6.0 12/08/2017 1758   GLUCOSEU NEGATIVE 12/08/2017 1758   HGBUR NEGATIVE 12/08/2017 1758   BILIRUBINUR NEGATIVE 12/08/2017 1758   KETONESUR NEGATIVE 12/08/2017 1758   PROTEINUR NEGATIVE 12/08/2017 1758   NITRITE NEGATIVE 12/08/2017 1758   LEUKOCYTESUR NEGATIVE 12/08/2017 1758   Sepsis Labs: @LABRCNTIP (procalcitonin:4,lacticidven:4)  ) Recent Results (from the past 240 hour(s))  Group A Strep by PCR     Status: None   Collection Time: 12/08/17  5:10 PM  Result Value Ref Range Status   Group A Strep by PCR NOT DETECTED NOT DETECTED Final     Comment: Performed at [facility], [place] [address], [place], [place] [address]  Culture, blood (routine x 2)     Status: None (Preliminary result)   Collection Time: 12/08/17  5:10 PM  Result Value Ref Range Status   Specimen Description   Final    BLOOD RIGHT ANTECUBITAL Performed at [place] [address] [place], [place] [address]    Special Requests   Final    BOTTLES DRAWN AEROBIC AND ANAEROBIC Blood Culture adequate volume Performed at [facility] [address], [place], [place] [address]    Culture   Final    NO GROWTH 3 DAYS Performed at [facility], [place] [address], [place], [place] [address]    Report Status PENDING  Incomplete  Culture, blood (routine x 2)     Status: None (Preliminary result)   Collection Time: 12/08/17  5:15 PM  Result Value Ref Range  Status   Specimen Description   Final    BLOOD RIGHT ANTECUBITAL Performed at [place] [address] [place], [place] [address]    Special Requests   Final    BOTTLES DRAWN AEROBIC AND ANAEROBIC Blood Culture adequate volume Performed at [place] [address], [place], [place] [address]    Culture   Final    NO GROWTH 3 DAYS Performed at [facility], [place] [address] [place], [place] [address]    Report Status PENDING  Incomplete  Respiratory Panel by PCR     Status: None   Collection Time: 12/08/17  7:01 PM  Result Value Ref Range Status   Adenovirus NOT DETECTED NOT DETECTED Final   Coronavirus 229E NOT DETECTED NOT DETECTED Final   Coronavirus HKU1 NOT DETECTED NOT DETECTED Final   Coronavirus NL63 NOT DETECTED NOT DETECTED Final   Coronavirus OC43 NOT DETECTED NOT DETECTED Final   Metapneumovirus NOT DETECTED NOT DETECTED Final   Rhinovirus / Enterovirus NOT DETECTED NOT DETECTED Final   Influenza A NOT DETECTED NOT DETECTED Final   Influenza B NOT DETECTED NOT DETECTED Final    Parainfluenza Virus 1 NOT DETECTED NOT DETECTED Final   Parainfluenza Virus 2 NOT DETECTED NOT DETECTED Final   Parainfluenza Virus 3 NOT DETECTED NOT DETECTED Final   Parainfluenza Virus 4 NOT DETECTED NOT DETECTED Final   Respiratory Syncytial Virus NOT DETECTED NOT DETECTED Final   Bordetella pertussis NOT DETECTED NOT DETECTED Final   Chlamydophila pneumoniae NOT DETECTED NOT DETECTED Final   Mycoplasma pneumoniae NOT DETECTED NOT DETECTED Final    Comment: Performed at [facility], [place] [address], [place], [place] [address]      Studies: No results found.  Scheduled Meds: . acyclovir  400 mg Oral BID  . amLODipine  10 mg Oral Daily  . fluticasone  1 spray Each Nare Daily  . voriconazole  200 mg Oral Q12H    Continuous Infusions: . ceFEPime (MAXIPIME) IV Stopped (12/12/17 0830)     LOS: 4 days     Alma Friendly, MD [facility]   If 7PM-7AM, please contact night-coverage [email or web address] Password [email or web address] 12/12/2017, 2:42 PM

## 2017-12-13 LAB — BASIC METABOLIC PANEL
Anion gap: 10 (ref 5–15)
BUN: 12 mg/dL (ref 6–20)
CALCIUM: 9.1 mg/dL (ref 8.9–10.3)
CO2: 25 mmol/L (ref 22–32)
Chloride: 104 mmol/L (ref 101–111)
Creatinine, Ser: 1.03 mg/dL (ref 0.61–1.24)
GFR calc non Af Amer: >60 mL/min (ref >60)
GLUCOSE: 120 mg/dL — AB (ref 65–99)
POTASSIUM: 4.4 mmol/L (ref 3.5–5.1)
Sodium: 139 mmol/L (ref 135–145)

## 2017-12-13 LAB — CULTURE, BLOOD (ROUTINE X 2)
CULTURE: NO GROWTH
Culture: NO GROWTH
Special Requests: ADEQUATE
Special Requests: ADEQUATE

## 2017-12-13 LAB — CBC WITH DIFFERENTIAL/PLATELET
BASOS PCT: 0 %
Basophils Absolute: 0 10*3/uL (ref 0.0–0.1)
EOS PCT: 6 %
Eosinophils Absolute: 0.1 10*3/uL (ref 0.0–0.7)
HEMATOCRIT: 28.3 % — AB (ref 39.0–52.0)
HEMOGLOBIN: 10.1 g/dL — AB (ref 13.0–17.0)
Lymphocytes Relative: 67 %
Lymphs Abs: 0.8 10*3/uL (ref 0.7–4.0)
MCH: 35.1 pg — ABNORMAL HIGH (ref 26.0–34.0)
MCHC: 35.7 g/dL (ref 30.0–36.0)
MCV: 98.3 fL (ref 78.0–100.0)
MONOS PCT: 0 %
Monocytes Absolute: 0 10*3/uL — ABNORMAL LOW (ref 0.1–1.0)
NEUTROS PCT: 27 %
Neutro Abs: 0.4 10*3/uL — ABNORMAL LOW (ref 1.7–7.7)
Platelets: 87 10*3/uL — ABNORMAL LOW (ref 150–400)
RBC: 2.88 MIL/uL — AB (ref 4.22–5.81)
RDW: 15.7 % — ABNORMAL HIGH (ref 11.5–15.5)
WBC: 1.3 10*3/uL — AB (ref 4.0–10.5)

## 2017-12-13 NOTE — Progress Notes (Signed)
PROGRESS NOTE  Derrick Callahan DJS:[id number] DOB: [birth year] DOA: 12/08/2017 PCP: Patient, No Pcp Per  HPI/Recap of past 24 hours: Derrick Callahan is a 47 y.o. male with medical history significant for acute myeloid leukemia, hepatitis C, and hypertension, presenting to the ED for evaluation of sore throat, chills, and productive cough for the past week PTA.  Patient follows with oncology at [facility] and reports that his last chemotherapy infusion was approximately 1 month ago. In the ED, CBC is notable for a leukopenia with WBC 1000, normocytic anemia with hemoglobin 10.0, and thrombocytopenia with platelets 87,000. Chest x-ray is clear.  UA unremarkable. Oncology at [facility] recommends admission (no oncology bed at their facility) with empiric cefepime, voriconazole, and acyclovir, with plan for transfer to [facility] Spoke to Oncology on call at [facility] on 12/09/17, suggested pt stay in [place] as currently pt is stable and they wouldn't do anything different over at [facility].  Today pt denies any new complaints, no fever/chills, headaches, abdominal pain, chest pain.   Assessment/Plan: Principal Problem:   Neutropenia with fever (HCC) Active Problems:   AML (acute myelogenous leukemia) (HCC)   Leukopenia due to antineoplastic chemotherapy ([place])   Pancytopenia due to antineoplastic chemotherapy ([place])   Neutropenic fever ([place])   Essential hypertension  Neutropenic fever/Hx of AML on chemo likely due to CAP Currently afebrile, neutropenic (ANC 0.4) CT chest showed: LLL pneumonia with a minimal area of airspace disease in the right upper lobe also concerning for pneumonia UA unremarkable, rapid strep neg, viral panel negative Blood cultures X 2 NGTD Spoke to Oncologist on call at [facility] on 12/11/17, cell phone no is [phone] rec patient stay in hospital for the entire duration of IV antibiotics, total of 7 days (end date will be 12/15/17) due to evidence of PNA on CT chest and pt still  neutropenic. Continue cefepime, voriconazole, and acyclovir  Daily CBC  AKI Resolved, encourage oral intake Daily BMP  AML  Follows with oncology at [facility]   Hypertension  BP at goal, continue Norvasc    Pancytopenia Likely due to chemo/AML No bleeding Daily CBC     Code Status: Full  Family Communication: None at bedside   Disposition Plan: Likely D/C on 12/15/17 after completion of IV antibiotics   Consultants:  Spoke with [facility] oncologist on call on 12/11/17  Procedures:  None  Antimicrobials:  IV Cefepime  IV Acyclovir  PO Voriconazole  DVT prophylaxis:  SCDs due to pancytopenia. Pt encouraged to ambulate   Objective: Vitals:   12/12/17 0600 12/12/17 1326 12/12/17 2109 12/13/17 0550  BP: 132/65 122/79 126/89 115/74  Pulse: 79 80 78 63  Resp: 18 16 14 19   Temp: 97.7 F (36.5 C) 98.2 F (36.8 C) 98.3 F (36.8 C) 99 F (37.2 C)  TempSrc: Oral Oral Oral Oral  SpO2: 100% 98% 99% 100%  Weight:      Height:        Intake/Output Summary (Last 24 hours) at 12/13/2017 1429 Last data filed at 12/13/2017 0615 Gross per 24 hour  Intake 1560 ml  Output -  Net 1560 ml   Filed Weights   12/08/17 1522  Weight: 85.7 kg (189 lb)    Exam:   General:  NAD  Cardiovascular: S1, S2 present  Respiratory: CTAB  Abdomen: Soft, NT, ND, BS present  Musculoskeletal: No pedal edema bilaterally  Skin: Normal  Psychiatry: Normal mood   Data Reviewed: CBC: Recent Labs  Lab 12/09/17 0322 12/10/17 0330 12/11/17 0436  12/12/17 0340 12/13/17 0416  WBC 1.0* 1.1* 1.1* 1.1* 1.3*  NEUTROABS 0.2* 0.2* 0.2* 0.2* 0.4*  HGB 9.6* 9.7* 9.5* 9.6* 10.1*  HCT 26.8* 27.1* 26.6* 26.5* 28.3*  MCV 98.5 96.8 97.1 98.1 98.3  PLT 86* 81* 86* 87* 87*   Basic Metabolic Panel: Recent Labs  Lab 12/08/17 1710 12/09/17 0322 12/10/17 0330 12/11/17 0436 12/13/17 0416  NA 139 139 139 140 139  K 3.7 3.6 3.8 4.0 4.4  CL 105 104 105 106 104  CO2 25 27 27 26 25     GLUCOSE 113* 133* 138* 125* 120*  BUN 12 11 9 12 12   CREATININE 1.17 1.32* 1.11 1.03 1.03  CALCIUM 8.8* 8.7* 8.8* 9.0 9.1   GFR: Estimated Creatinine Clearance: 94.5 mL/min (by C-G formula based on SCr of 1.03 mg/dL). Liver Function Tests: Recent Labs  Lab 12/08/17 1902  AST 33  ALT 32  ALKPHOS 65  BILITOT 1.1  PROT 7.4  ALBUMIN 3.6   No results for input(s): LIPASE, AMYLASE in the last 168 hours. No results for input(s): AMMONIA in the last 168 hours. Coagulation Profile: No results for input(s): INR, PROTIME in the last 168 hours. Cardiac Enzymes: No results for input(s): CKTOTAL, CKMB, CKMBINDEX, TROPONINI in the last 168 hours. BNP (last 3 results) No results for input(s): PROBNP in the last 8760 hours. HbA1C: No results for input(s): HGBA1C in the last 72 hours. CBG: No results for input(s): GLUCAP in the last 168 hours. Lipid Profile: No results for input(s): CHOL, HDL, LDLCALC, TRIG, CHOLHDL, LDLDIRECT in the last 72 hours. Thyroid Function Tests: No results for input(s): TSH, T4TOTAL, FREET4, T3FREE, THYROIDAB in the last 72 hours. Anemia Panel: No results for input(s): VITAMINB12, FOLATE, FERRITIN, TIBC, IRON, RETICCTPCT in the last 72 hours. Urine analysis:    Component Value Date/Time   COLORURINE YELLOW 12/08/2017 1758   APPEARANCEUR CLEAR 12/08/2017 1758   LABSPEC 1.013 12/08/2017 1758   PHURINE 6.0 12/08/2017 1758   GLUCOSEU NEGATIVE 12/08/2017 1758   HGBUR NEGATIVE 12/08/2017 1758   BILIRUBINUR NEGATIVE 12/08/2017 1758   KETONESUR NEGATIVE 12/08/2017 1758   PROTEINUR NEGATIVE 12/08/2017 1758   NITRITE NEGATIVE 12/08/2017 1758   LEUKOCYTESUR NEGATIVE 12/08/2017 1758   Sepsis Labs: @LABRCNTIP (procalcitonin:4,lacticidven:4)  ) Recent Results (from the past 240 hour(s))  Group A Strep by PCR     Status: None   Collection Time: 12/08/17  5:10 PM  Result Value Ref Range Status   Group A Strep by PCR NOT DETECTED NOT DETECTED Final    Comment:  Performed at [facility], [place] [address], [place], [place] [address]  Culture, blood (routine x 2)     Status: None (Preliminary result)   Collection Time: 12/08/17  5:10 PM  Result Value Ref Range Status   Specimen Description   Final    BLOOD RIGHT ANTECUBITAL Performed at [place] [address], [place], [place] [address]    Special Requests   Final    BOTTLES DRAWN AEROBIC AND ANAEROBIC Blood Culture adequate volume Performed at [place] [address] [place], [place] [address]    Culture   Final    NO GROWTH 4 DAYS Performed at [facility], [place] [address], [place], [place] [address]    Report Status PENDING  Incomplete  Culture, blood (routine x 2)     Status: None (Preliminary result)   Collection Time: 12/08/17  5:15 PM  Result Value Ref Range Status  Specimen Description   Final    BLOOD RIGHT ANTECUBITAL Performed at [place] [address], [place], [place] [address]    Special Requests   Final    BOTTLES DRAWN AEROBIC AND ANAEROBIC Blood Culture adequate volume Performed at [place] [address], [place], [place] [address]    Culture   Final    NO GROWTH 4 DAYS Performed at [facility], [place] [address], [place], [place] [address]    Report Status PENDING  Incomplete  Respiratory Panel by PCR     Status: None   Collection Time: 12/08/17  7:01 PM  Result Value Ref Range Status   Adenovirus NOT DETECTED NOT DETECTED Final   Coronavirus 229E NOT DETECTED NOT DETECTED Final   Coronavirus HKU1 NOT DETECTED NOT DETECTED Final   Coronavirus NL63 NOT DETECTED NOT DETECTED Final   Coronavirus OC43 NOT DETECTED NOT DETECTED Final   Metapneumovirus NOT DETECTED NOT DETECTED Final   Rhinovirus / Enterovirus NOT DETECTED NOT DETECTED Final   Influenza A NOT DETECTED NOT DETECTED Final   Influenza B NOT DETECTED NOT DETECTED Final    Parainfluenza Virus 1 NOT DETECTED NOT DETECTED Final   Parainfluenza Virus 2 NOT DETECTED NOT DETECTED Final   Parainfluenza Virus 3 NOT DETECTED NOT DETECTED Final   Parainfluenza Virus 4 NOT DETECTED NOT DETECTED Final   Respiratory Syncytial Virus NOT DETECTED NOT DETECTED Final   Bordetella pertussis NOT DETECTED NOT DETECTED Final   Chlamydophila pneumoniae NOT DETECTED NOT DETECTED Final   Mycoplasma pneumoniae NOT DETECTED NOT DETECTED Final    Comment: Performed at [facility], [place] [address], [place], [place] [address]      Studies: No results found.  Scheduled Meds: . acyclovir  400 mg Oral BID  . amLODipine  10 mg Oral Daily  . fluticasone  1 spray Each Nare Daily  . voriconazole  200 mg Oral Q12H    Continuous Infusions: . ceFEPime (MAXIPIME) IV 2 g (12/13/17 0841)     LOS: 5 days     Alma Friendly, MD [facility]   If 7PM-7AM, please contact night-coverage [email or web address] Password [facility] 12/13/2017, 2:29 PM

## 2017-12-14 LAB — CBC WITH DIFFERENTIAL/PLATELET
BASOS PCT: 0 %
Basophils Absolute: 0 10*3/uL (ref 0.0–0.1)
EOS ABS: 0.1 10*3/uL (ref 0.0–0.7)
Eosinophils Relative: 5 %
HCT: 27.5 % — ABNORMAL LOW (ref 39.0–52.0)
HEMOGLOBIN: 10 g/dL — AB (ref 13.0–17.0)
Lymphocytes Relative: 62 %
Lymphs Abs: 0.7 10*3/uL (ref 0.7–4.0)
MCH: 35.8 pg — AB (ref 26.0–34.0)
MCHC: 36.4 g/dL — AB (ref 30.0–36.0)
MCV: 98.6 fL (ref 78.0–100.0)
MONO ABS: 0 10*3/uL — AB (ref 0.1–1.0)
Monocytes Relative: 4 %
NEUTROS ABS: 0.3 10*3/uL — AB (ref 1.7–7.7)
Neutrophils Relative %: 26 %
Other: 3 %
PLATELETS: 85 10*3/uL — AB (ref 150–400)
RBC: 2.79 MIL/uL — ABNORMAL LOW (ref 4.22–5.81)
RDW: 15.9 % — ABNORMAL HIGH (ref 11.5–15.5)
WBC: 1.1 10*3/uL — CL (ref 4.0–10.5)

## 2017-12-14 LAB — PATHOLOGIST SMEAR REVIEW

## 2017-12-14 MED ORDER — ACYCLOVIR 400 MG PO TABS: 400.0000 mg | ORAL_TABLET | Freq: Two times a day (BID) | ORAL | 0 refills | Status: AC | PRN

## 2017-12-14 MED ORDER — CEFEPIME HCL 2 G IJ SOLR
2.0000 g | Freq: Three times a day (TID) | INTRAMUSCULAR | Status: DC
  Filled 2017-12-14: qty 2

## 2017-12-14 MED ORDER — LEVOFLOXACIN 500 MG PO TABS: 500.0000 mg | ORAL_TABLET | Freq: Every day | ORAL | 0 refills | Status: AC

## 2017-12-14 NOTE — Discharge Summary (Signed)
Discharge Summary  Derrick Callahan YQI:[id number] DOB: 09/18/1970  PCP: Patient, No Pcp Per  Admit date: 12/08/2017 Discharge date: 12/14/2017  Time spent: 45 mins  Recommendations for Outpatient Follow-up:  1. PCP 2. Hem/Onc with Dr Zenda Alpers on 12/17/17 (appointment scheduled)  Discharge Diagnoses:  Active Hospital Problems   Diagnosis Date Noted  . Neutropenia with fever ([place]) 12/08/2017  . Neutropenic fever ([place]) 12/08/2017  . Essential hypertension 12/08/2017  . Pancytopenia due to antineoplastic chemotherapy ([place]) 10/04/2014  . Leukopenia due to antineoplastic chemotherapy ([place]) 07/27/2014  . AML (acute myelogenous leukemia) ([place]) 07/26/2014    Resolved Hospital Problems  No resolved problems to display.    Discharge Condition: Stable   Diet recommendation: Regular  Vitals:   12/13/17 2154 12/14/17 0540  BP: 130/83 122/78  Pulse: 82 72  Resp: 18 18  Temp: 98.4 F (36.9 C) 98.4 F (36.9 C)  SpO2: 100% 100%    History of present illness:  Derrick Harperis a 47 y.o.malewith medical history significant foracute myeloid leukemia, hepatitis C, and hypertension, presenting to the ED for evaluation of sore throat, chills, and productive cough for the past week PTA. Patient follows with oncology at [facility] and reports that his last chemotherapy infusion was approximately 1 month ago. In the ED, CBC is notable for a leukopenia with WBC 1000, normocytic anemia with hemoglobin 10.0, and thrombocytopenia with platelets 87,000. Chest x-ray is clear. UA unremarkable. Oncology at [facility] recommends admission (no oncology bed at their facility) with empiric cefepime, voriconazole, and acyclovir, with plan for transfer to [facility]. Spoke to Oncology on call at [facility] on 12/09/17, suggested pt stay in [place] as currently pt is stable and they wouldn't do anything different over at [facility].  Today pt denies any new complaints. Very eager to go home.   Hospital Course:   Principal Problem:   Neutropenia with fever ([place]) Active Problems:   AML (acute myelogenous leukemia) (HCC)   Leukopenia due to antineoplastic chemotherapy ([place])   Pancytopenia due to antineoplastic chemotherapy ([place])   Neutropenic fever ([place])   Essential hypertension  Neutropenic fever/Hx of AML on chemo likely due to CAP Currently afebrile, neutropenic (ANC 0.2) CT chest showed: LLL pneumonia with a minimal area of airspace disease in the right upper lobe also concerning for pneumonia UA unremarkable, rapid strep neg, viral panel negative Blood cultures X 2 NGTD Spoke to Oncologist on call at [facility] on 12/14/17, cell phone no is [phone] agree with discharge with close follow up. (appointment scheduled for 12/17/17 with Dr Linus Orn at [facility]) S/P IV Cefepime, voriconazole, and acyclovir Pt instructed to continue taking levaquin 500 mg daily, voriconazole and acyclovir  AKI Resolved, encourage oral intake  AML Follows with oncology at [facility]  Hypertension BP at goal, continue Norvasc  Pancytopenia Likely due to chemo/AML No bleeding     Procedures:  None  Consultations: Spoke to Oncologist on call at [facility]  Discharge Exam: BP 122/78 (BP Location: Right Arm)   Pulse 72   Temp 98.4 F (36.9 C) (Oral)   Resp 18   Ht 5' 11.75" (1.822 m)   Wt 85.7 kg (189 lb)   SpO2 100%   BMI 25.81 kg/m   General: NAD Cardiovascular: S1, S2 present Respiratory: CTAB  Discharge Instructions You were cared for by a hospitalist during your hospital stay. If you have any questions about your discharge medications or the care you received while you were in the hospital after you are discharged, you can call the  unit and asked to speak with the hospitalist on call if the hospitalist that took care of you is not available. Once you are discharged, your primary care physician will handle any further medical issues. Please note that NO REFILLS for any discharge medications will be  authorized once you are discharged, as it is imperative that you return to your primary care physician (or establish a relationship with a primary care physician if you do not have one) for your aftercare needs so that they can reassess your need for medications and monitor your lab values.  Discharge Instructions    Diet - low sodium heart healthy   Complete by:  As directed    Increase activity slowly   Complete by:  As directed      Allergies as of 12/14/2017   No Known Allergies     Medication List    TAKE these medications   acyclovir 400 MG tablet Commonly known as:  ZOVIRAX Take 1 tablet (400 mg total) by mouth 2 (two) times daily as needed (neutropenic).   amLODipine 10 MG tablet Commonly known as:  NORVASC Take 10 mg by mouth daily.   levofloxacin 500 MG tablet Commonly known as:  LEVAQUIN Take 1 tablet (500 mg total) by mouth daily.   traMADol 50 MG tablet Commonly known as:  ULTRAM Take 50 mg by mouth every 6 (six) hours as needed for moderate pain.   voriconazole 200 MG tablet Commonly known as:  VFEND Take 200 mg by mouth every 12 (twelve) hours.      No Known Allergies    The results of significant diagnostics from this hospitalization (including imaging, microbiology, ancillary and laboratory) are listed below for reference.    Significant Diagnostic Studies: Dg Chest 2 View  Result Date: 12/08/2017 CLINICAL DATA:  Cough.  History of acute myelogenous leukemia EXAM: CHEST - 2 VIEW COMPARISON:  None. FINDINGS: Port-A-Cath tip is in the superior vena cava near the cavoatrial junction. No pneumothorax. Lungs are clear. Heart size and pulmonary vascularity are normal. No adenopathy. No bone lesions. IMPRESSION: Port-A-Cath tip immediately superior to cavoatrial junction. No pneumothorax. Lungs clear. No evident adenopathy. Electronically Signed   By: Lowella Grip III M.D.   On: 12/08/2017 17:36   Ct Chest Wo Contrast  Result Date: 12/09/2017 CLINICAL  DATA:  Productive cough, malaise EXAM: CT CHEST WITHOUT CONTRAST TECHNIQUE: Multidetector CT imaging of the chest was performed following the standard protocol without IV contrast. COMPARISON:  None. FINDINGS: Cardiovascular: No significant vascular findings. Normal heart size. No pericardial effusion. Mild thoracic aortic atherosclerosis. Coronary artery atherosclerosis in the LAD. Mediastinum/Nodes: No enlarged mediastinal or axillary lymph nodes. Thyroid gland, trachea, and esophagus demonstrate no significant findings. Lungs/Pleura: Mild left lower lobe airspace disease most concerning for pneumonia. Small area of nodular airspace disease in the right upper lobe most concerning for early pneumonia. No pleural effusion or pneumothorax. Upper Abdomen: No acute abnormality.  Small hiatal hernia. Musculoskeletal: No acute osseous abnormality. No aggressive osseous lesion. IMPRESSION: 1. Left lower lobe pneumonia with a minimal area of airspace disease in the right upper lobe also concerning for pneumonia. 2.  Aortic Atherosclerosis (ICD10-I70.0). Electronically Signed   By: Kathreen Devoid   On: 12/09/2017 13:28    Microbiology: Recent Results (from the past 240 hour(s))  Group A Strep by PCR     Status: None   Collection Time: 12/08/17  5:10 PM  Result Value Ref Range Status   Group A Strep by PCR NOT  DETECTED NOT DETECTED Final    Comment: Performed at [place] [address] [place], [place] [address]  Culture, blood (routine x 2)     Status: None   Collection Time: 12/08/17  5:10 PM  Result Value Ref Range Status   Specimen Description   Final    BLOOD RIGHT ANTECUBITAL Performed at [place] [address] [place], [place] [address]    Special Requests   Final    BOTTLES DRAWN AEROBIC AND ANAEROBIC Blood Culture adequate volume Performed at [place] [address] [place], [place] [address]    Culture   Final    NO  GROWTH 5 DAYS Performed at [facility], [place] [address], [place], [place] [address]    Report Status 12/13/2017 FINAL  Final  Culture, blood (routine x 2)     Status: None   Collection Time: 12/08/17  5:15 PM  Result Value Ref Range Status   Specimen Description   Final    BLOOD RIGHT ANTECUBITAL Performed at [place] [address] [place], [place] [address]    Special Requests   Final    BOTTLES DRAWN AEROBIC AND ANAEROBIC Blood Culture adequate volume Performed at [place] [address] [place], [place] [address]    Culture   Final    NO GROWTH 5 DAYS Performed at [facility], [place] [address] [place], [place] [address]    Report Status 12/13/2017 FINAL  Final  Respiratory Panel by PCR     Status: None   Collection Time: 12/08/17  7:01 PM  Result Value Ref Range Status   Adenovirus NOT DETECTED NOT DETECTED Final   Coronavirus 229E NOT DETECTED NOT DETECTED Final   Coronavirus HKU1 NOT DETECTED NOT DETECTED Final   Coronavirus NL63 NOT DETECTED NOT DETECTED Final   Coronavirus OC43 NOT DETECTED NOT DETECTED Final   Metapneumovirus NOT DETECTED NOT DETECTED Final   Rhinovirus / Enterovirus NOT DETECTED NOT DETECTED Final   Influenza A NOT DETECTED NOT DETECTED Final   Influenza B NOT DETECTED NOT DETECTED Final   Parainfluenza Virus 1 NOT DETECTED NOT DETECTED Final   Parainfluenza Virus 2 NOT DETECTED NOT DETECTED Final   Parainfluenza Virus 3 NOT DETECTED NOT DETECTED Final   Parainfluenza Virus 4 NOT DETECTED NOT DETECTED Final   Respiratory Syncytial Virus NOT DETECTED NOT DETECTED Final   Bordetella pertussis NOT DETECTED NOT DETECTED Final   Chlamydophila pneumoniae NOT DETECTED NOT DETECTED Final   Mycoplasma pneumoniae NOT DETECTED NOT DETECTED Final    Comment: Performed at [facility], [place] [address], [place], [place] [address]     Labs: Basic Metabolic Panel: Recent Labs  Lab  12/08/17 1710 12/09/17 0322 12/10/17 0330 12/11/17 0436 12/13/17 0416  NA 139 139 139 140 139  K 3.7 3.6 3.8 4.0 4.4  CL 105 104 105 106 104  CO2 25 27 27 26 25   GLUCOSE 113* 133* 138* 125* 120*  BUN 12 11 9 12 12   CREATININE 1.17 1.32* 1.11 1.03 1.03  CALCIUM 8.8* 8.7* 8.8* 9.0 9.1   Liver Function Tests: Recent Labs  Lab 12/08/17 1902  AST 33  ALT 32  ALKPHOS 65  BILITOT 1.1  PROT 7.4  ALBUMIN 3.6   No results for input(s): LIPASE, AMYLASE in the last 168 hours. No results for input(s): AMMONIA in the last 168 hours. CBC: Recent Labs  Lab 12/10/17  0330 12/11/17 0436 12/12/17 0340 12/13/17 0416 12/14/17 0427  WBC 1.1* 1.1* 1.1* 1.3* 1.1*  NEUTROABS 0.2* 0.2* 0.2* 0.4* 0.3*  HGB 9.7* 9.5* 9.6* 10.1* 10.0*  HCT 27.1* 26.6* 26.5* 28.3* 27.5*  MCV 96.8 97.1 98.1 98.3 98.6  PLT 81* 86* 87* 87* 85*   Cardiac Enzymes: No results for input(s): CKTOTAL, CKMB, CKMBINDEX, TROPONINI in the last 168 hours. BNP: BNP (last 3 results) No results for input(s): BNP in the last 8760 hours.  ProBNP (last 3 results) No results for input(s): PROBNP in the last 8760 hours.  CBG: No results for input(s): GLUCAP in the last 168 hours.     Signed:  Alma Friendly, MD [facility] 12/14/2017, 1:08 PM

## 2018-06-25 DEATH — deceased

## 2019-01-23 IMAGING — CR DG CHEST 2V
2 series · 2 of 2 positions shown · non-contrast
Comparison: None.

CLINICAL DATA: Cough.  History of acute myelogenous leukemia

EXAM:
CHEST - 2 VIEW

[w chest pa]
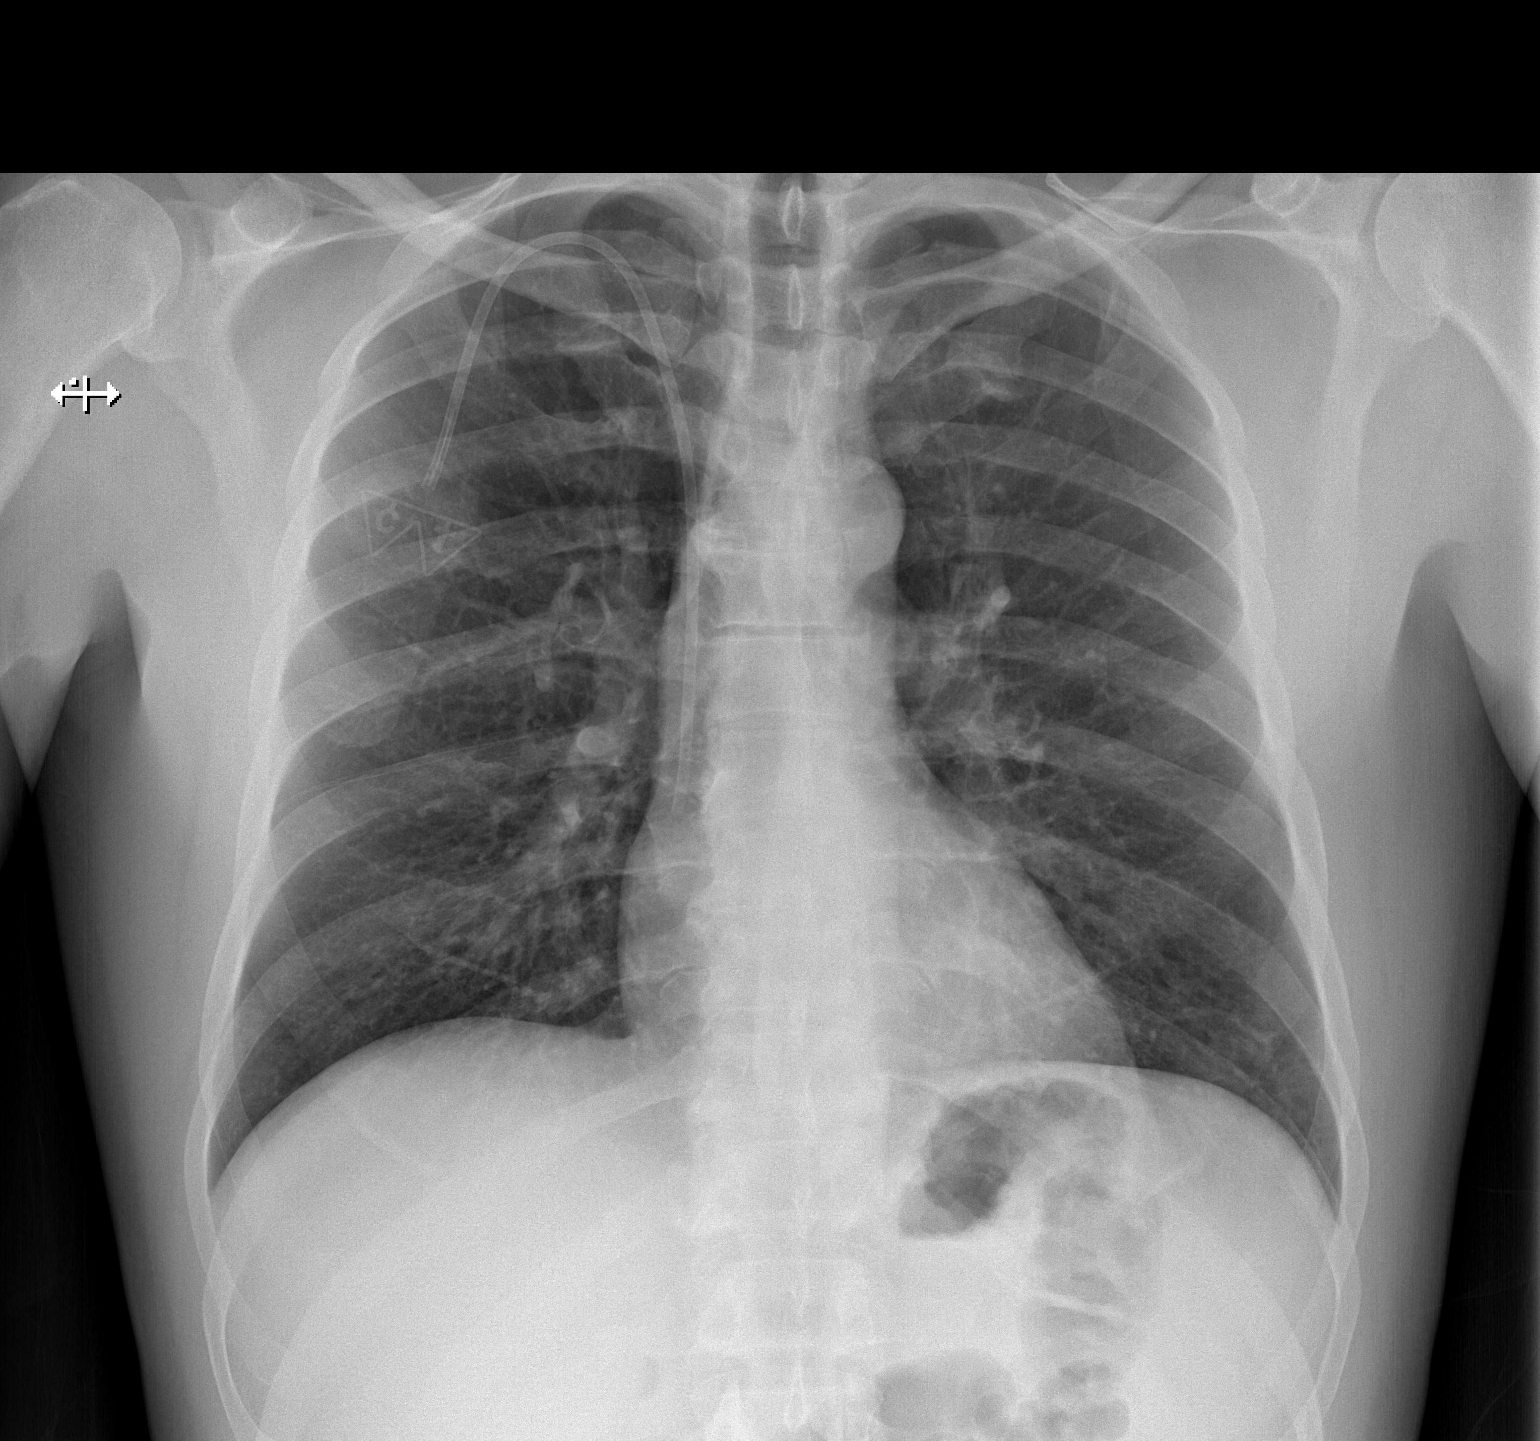

[w chest lat]
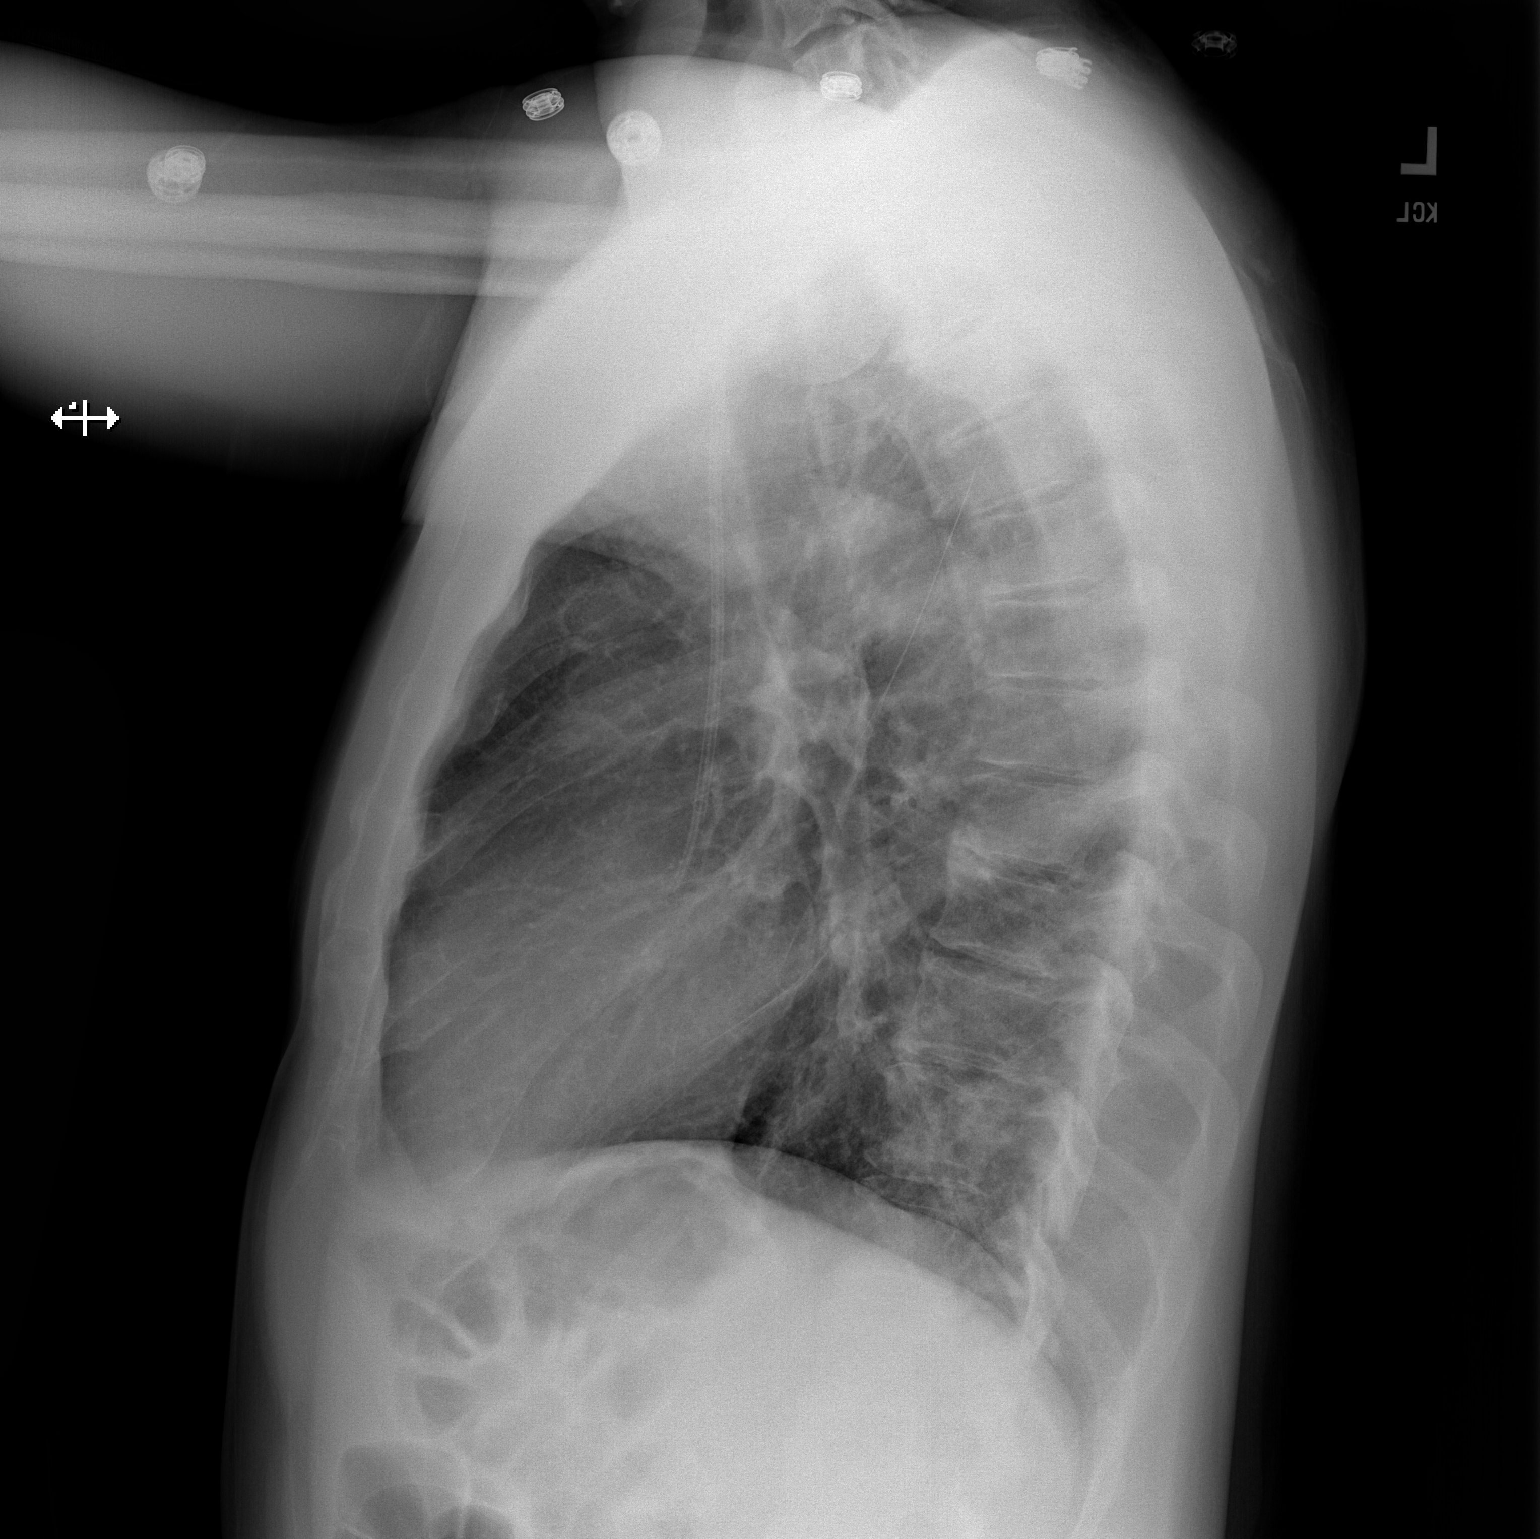

[2 of 2 positions shown; findings below may reference images not displayed]

FINDINGS: Port-A-Cath tip is in the superior vena cava near the cavoatrial
junction. No pneumothorax. Lungs are clear. Heart size and pulmonary
vascularity are normal. No adenopathy. No bone lesions.
IMPRESSION: Port-A-Cath tip immediately superior to cavoatrial junction. No
pneumothorax. Lungs clear. No evident adenopathy.

## 2019-01-24 IMAGING — CT CT CHEST W/O CM
2 of 3 series · 15 of 36 positions shown, 18 images · non-contrast
Comparison: None.

CLINICAL DATA: Productive cough, malaise

EXAM:
CT CHEST WITHOUT CONTRAST
TECHNIQUE: Multidetector CT imaging of the chest was performed following the
standard protocol without IV contrast.

[Series 2: thorax · axial · 0.65mm/px · z∈[-10,+242]mm · 12 of 150 slices shown, 15 images]
[im 12/150  mediastinal]
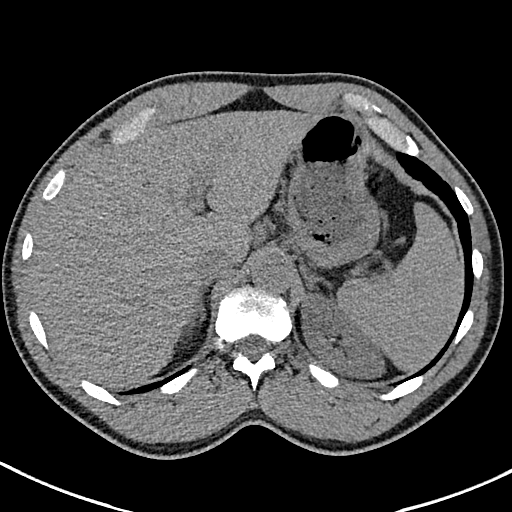
[im 12/150  lung]
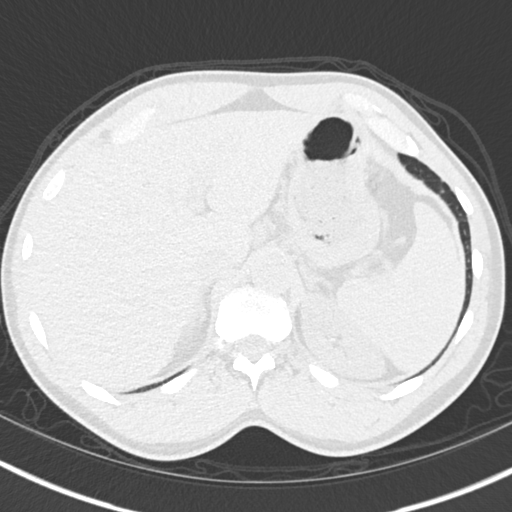
[im 23/150  lung]
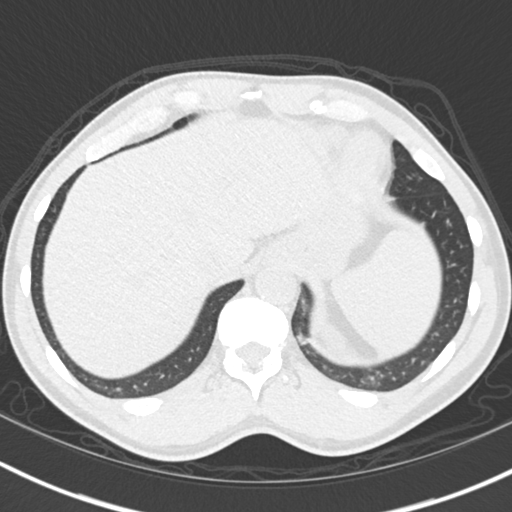
[im 34/150  lung]
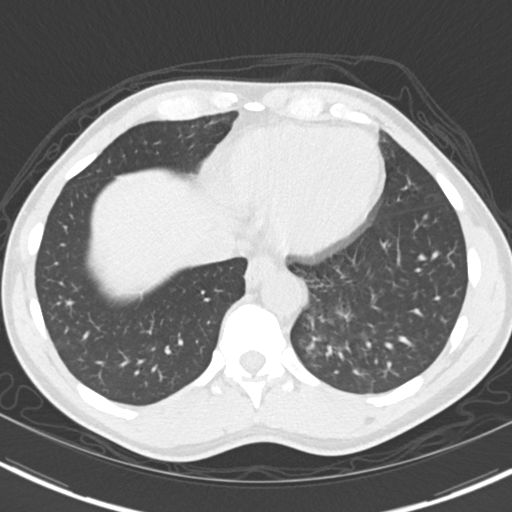
[im 45/150  lung]
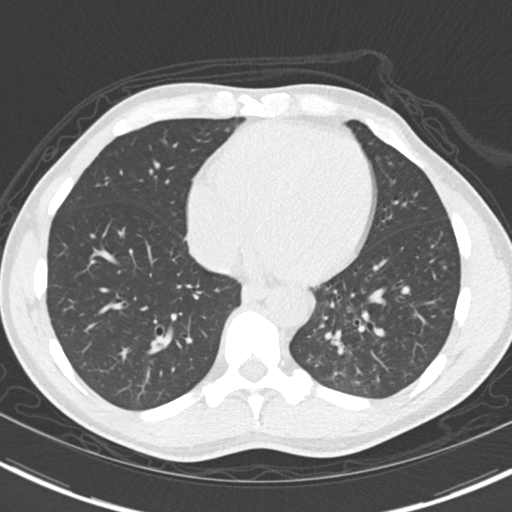
[im 56/150  mediastinal]
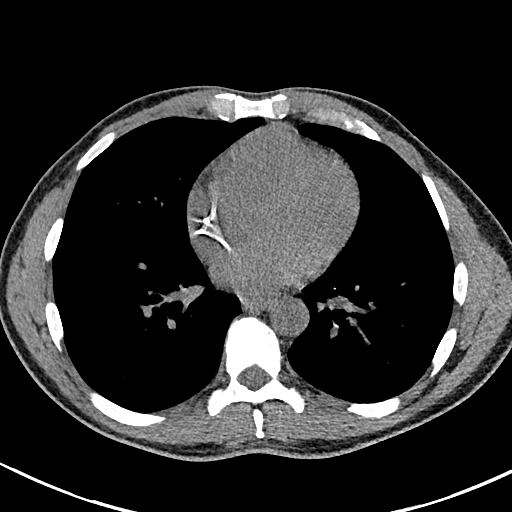
[im 56/150  lung]
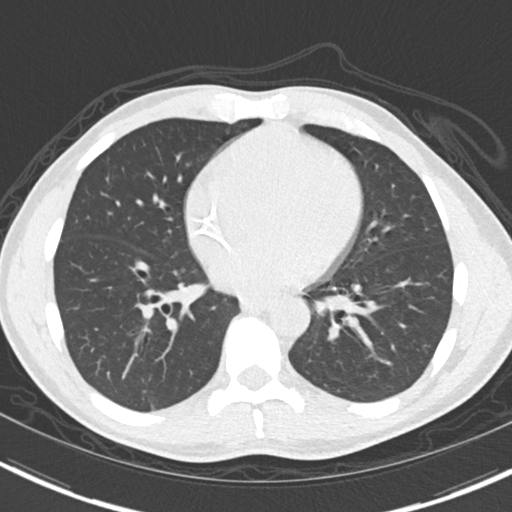
[im 67/150  lung]
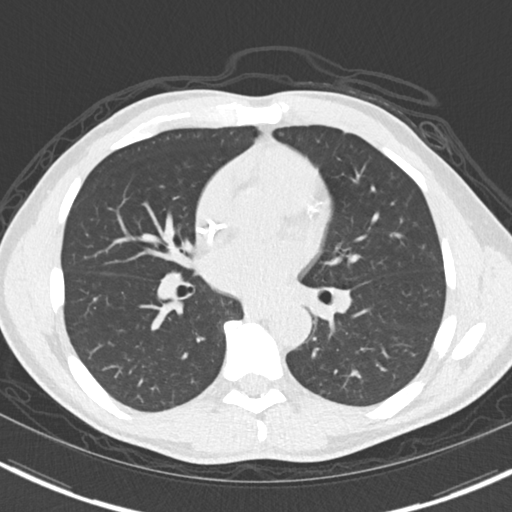
[im 83/150  lung]
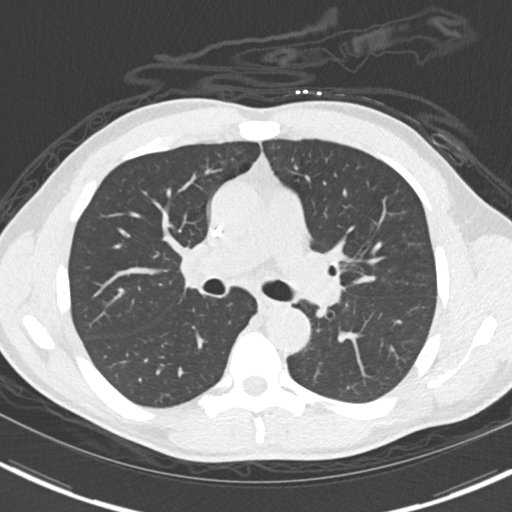
[im 94/150  lung]
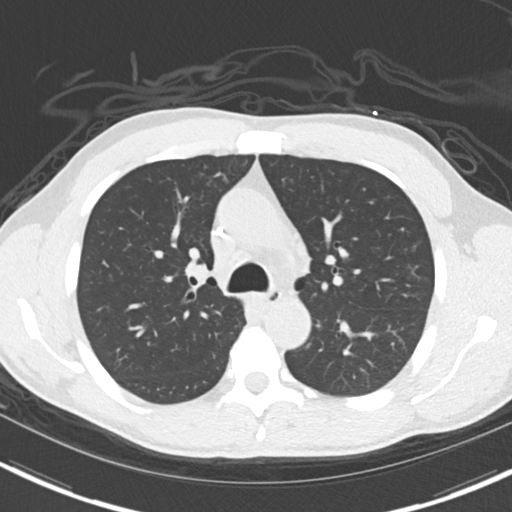
[im 105/150  mediastinal]
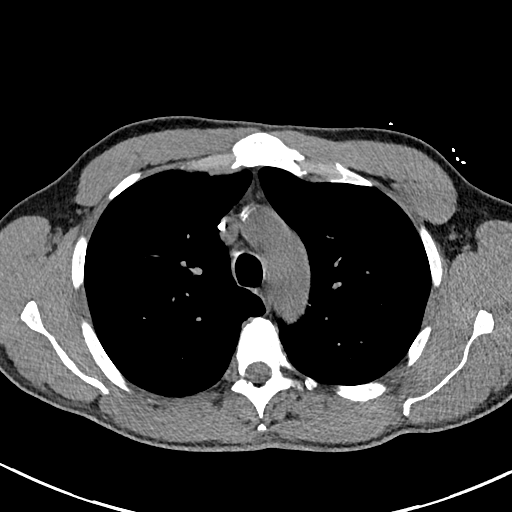
[im 105/150  lung]
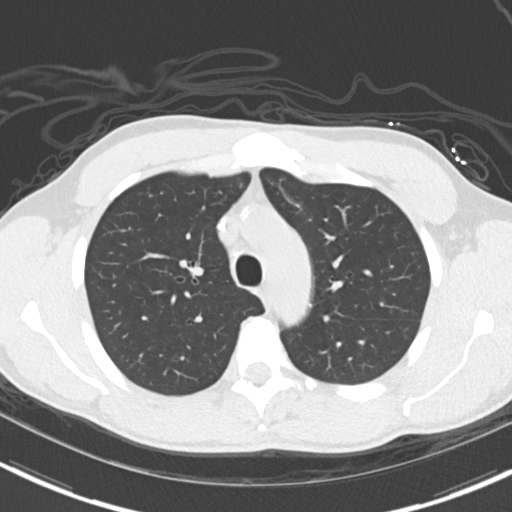
[im 116/150  lung]
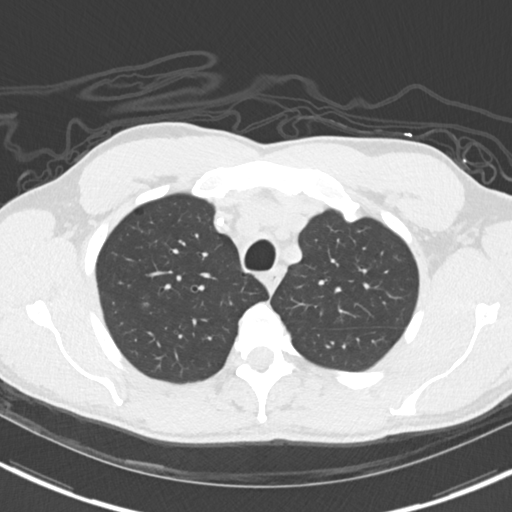
[im 127/150  lung]
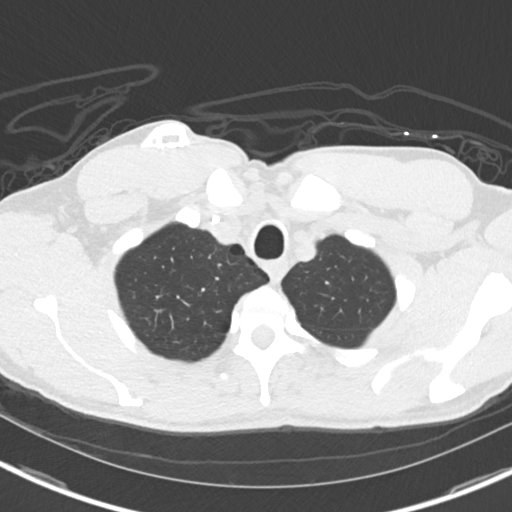
[im 138/150  lung]
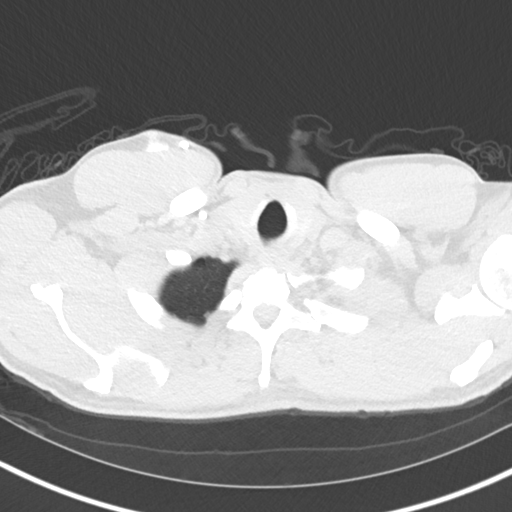

[Series 6: coronal · coronal · 0.62mm/px · 3 of 123 slices shown]
[im 25/123  lung]
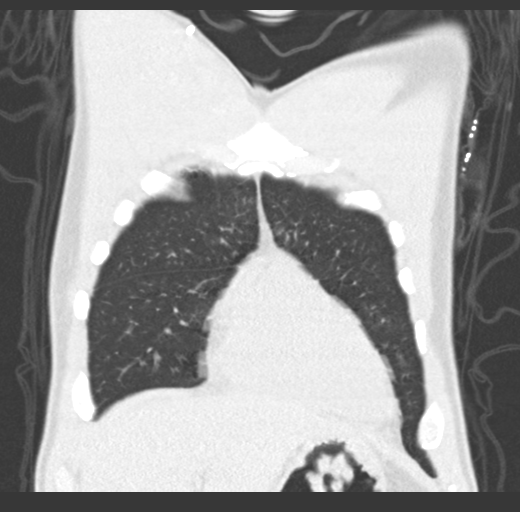
[im 49/123  lung]
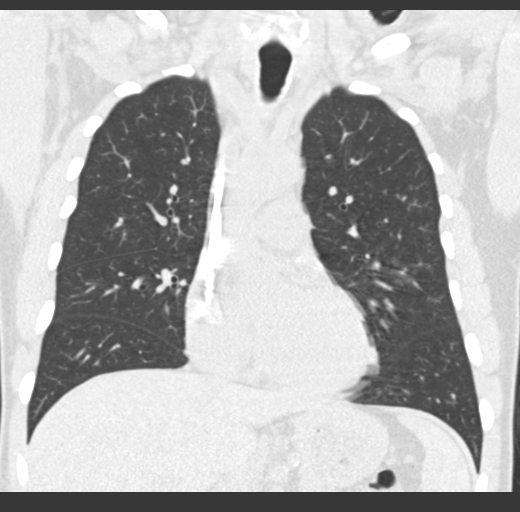
[im 74/123  lung]
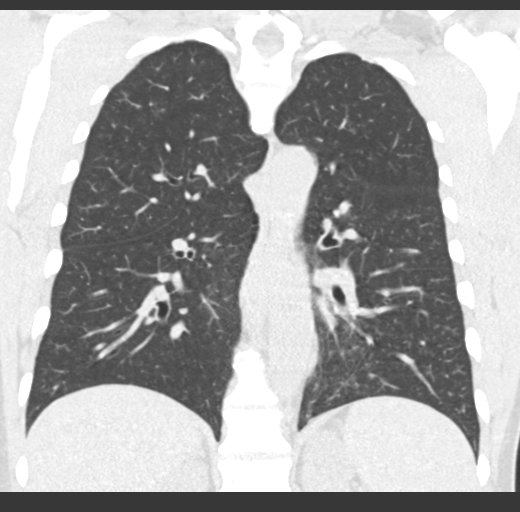

[15 of 36 positions shown; findings below may reference images not displayed]

FINDINGS: Cardiovascular: No significant vascular findings. Normal heart size.
No pericardial effusion. Mild thoracic aortic atherosclerosis.
Coronary artery atherosclerosis in the LAD.

Mediastinum/Nodes: No enlarged mediastinal or axillary lymph nodes.
Thyroid gland, trachea, and esophagus demonstrate no significant
findings.

Lungs/Pleura: Mild left lower lobe airspace disease most concerning
for pneumonia. Small area of nodular airspace disease in the right
upper lobe most concerning for early pneumonia. No pleural effusion
or pneumothorax.

Upper Abdomen: No acute abnormality.  Small hiatal hernia.

Musculoskeletal: No acute osseous abnormality. No aggressive osseous
lesion.
IMPRESSION: 1. Left lower lobe pneumonia with a minimal area of airspace disease
in the right upper lobe also concerning for pneumonia.
2.  Aortic Atherosclerosis ([id number]).
